# Patient Record
Sex: Female | Born: 1955 | Race: White | Hispanic: No | Marital: Married | Smoking: Never smoker
Health system: Southern US, Community
[De-identification: ages and names within clinical notes are randomized; demographics above are authoritative.]

## PROBLEM LIST (undated history)

## (undated) DIAGNOSIS — E785 Hyperlipidemia, unspecified: Secondary | ICD-10-CM

## (undated) DIAGNOSIS — I10 Essential (primary) hypertension: Secondary | ICD-10-CM

## (undated) HISTORY — DX: Essential (primary) hypertension: I10

## (undated) HISTORY — PX: TONSILLECTOMY: SHX5217

## (undated) HISTORY — DX: Hyperlipidemia, unspecified: E78.5

---

## 1997-06-13 ENCOUNTER — Ambulatory Visit (HOSPITAL_COMMUNITY): Admission: RE | Admit: 1997-06-13 | Discharge: 1997-06-13 | Payer: Self-pay | Admitting: *Deleted

## 1998-07-25 ENCOUNTER — Other Ambulatory Visit: Admission: RE | Admit: 1998-07-25 | Discharge: 1998-07-25 | Payer: Self-pay | Admitting: *Deleted

## 2000-09-21 ENCOUNTER — Other Ambulatory Visit: Admission: RE | Admit: 2000-09-21 | Discharge: 2000-09-21 | Payer: Self-pay | Admitting: *Deleted

## 2001-12-08 ENCOUNTER — Other Ambulatory Visit: Admission: RE | Admit: 2001-12-08 | Discharge: 2001-12-08 | Payer: Self-pay | Admitting: Obstetrics & Gynecology

## 2003-02-06 ENCOUNTER — Other Ambulatory Visit: Admission: RE | Admit: 2003-02-06 | Discharge: 2003-02-06 | Payer: Self-pay | Admitting: Obstetrics & Gynecology

## 2004-03-26 ENCOUNTER — Other Ambulatory Visit: Admission: RE | Admit: 2004-03-26 | Discharge: 2004-03-26 | Payer: Self-pay | Admitting: Obstetrics & Gynecology

## 2005-05-25 ENCOUNTER — Other Ambulatory Visit: Admission: RE | Admit: 2005-05-25 | Discharge: 2005-05-25 | Payer: Self-pay | Admitting: Obstetrics & Gynecology

## 2006-01-11 HISTORY — PX: CARDIAC CATHETERIZATION: SHX172

## 2006-01-19 ENCOUNTER — Ambulatory Visit: Payer: Self-pay | Admitting: Cardiology

## 2006-01-19 ENCOUNTER — Observation Stay (HOSPITAL_COMMUNITY): Admission: EM | Admit: 2006-01-19 | Discharge: 2006-01-20 | Payer: Self-pay | Admitting: Emergency Medicine

## 2006-02-05 ENCOUNTER — Ambulatory Visit: Payer: Self-pay | Admitting: Cardiology

## 2006-08-05 ENCOUNTER — Ambulatory Visit: Payer: Self-pay | Admitting: Cardiology

## 2007-09-07 ENCOUNTER — Ambulatory Visit: Payer: Self-pay | Admitting: Cardiology

## 2008-12-25 ENCOUNTER — Ambulatory Visit: Payer: Self-pay | Admitting: Family Medicine

## 2008-12-25 DIAGNOSIS — I10 Essential (primary) hypertension: Secondary | ICD-10-CM | POA: Insufficient documentation

## 2008-12-25 DIAGNOSIS — R635 Abnormal weight gain: Secondary | ICD-10-CM | POA: Insufficient documentation

## 2008-12-25 DIAGNOSIS — E785 Hyperlipidemia, unspecified: Secondary | ICD-10-CM | POA: Insufficient documentation

## 2009-01-01 ENCOUNTER — Encounter: Payer: Self-pay | Admitting: Family Medicine

## 2009-01-11 ENCOUNTER — Encounter (INDEPENDENT_AMBULATORY_CARE_PROVIDER_SITE_OTHER): Payer: Self-pay

## 2010-01-14 ENCOUNTER — Telehealth: Payer: Self-pay | Admitting: Family Medicine

## 2010-01-23 ENCOUNTER — Ambulatory Visit: Payer: Self-pay | Admitting: Family Medicine

## 2010-01-23 LAB — CONVERTED CEMR LAB
Albumin: 4.1 g/dL (ref 3.5–5.2)
BUN: 10 mg/dL (ref 6–23)
Basophils Absolute: 0 10*3/uL (ref 0.0–0.1)
Basophils Relative: 0.2 % (ref 0.0–3.0)
Bilirubin Urine: NEGATIVE
Bilirubin, Direct: 0.1 mg/dL (ref 0.0–0.3)
CO2: 24 meq/L (ref 19–32)
Calcium: 9.1 mg/dL (ref 8.4–10.5)
Chloride: 105 meq/L (ref 96–112)
Cholesterol: 164 mg/dL (ref 0–200)
Creatinine, Ser: 0.8 mg/dL (ref 0.4–1.2)
Eosinophils Absolute: 0.1 10*3/uL (ref 0.0–0.7)
Glucose, Bld: 96 mg/dL (ref 70–99)
Glucose, Urine, Semiquant: NEGATIVE
HDL: 50.8 mg/dL (ref >39.00)
Ketones, urine, test strip: NEGATIVE
Lymphocytes Relative: 25.2 % (ref 12.0–46.0)
MCHC: 34 g/dL (ref 30.0–36.0)
MCV: 94.8 fL (ref 78.0–100.0)
Monocytes Absolute: 0.8 10*3/uL (ref 0.1–1.0)
Neutrophils Relative %: 67.4 % (ref 43.0–77.0)
RBC: 4.88 M/uL (ref 3.87–5.11)
RDW: 12.8 % (ref 11.5–14.6)
Specific Gravity, Urine: 1.02
Total CHOL/HDL Ratio: 3
Total Protein: 7.2 g/dL (ref 6.0–8.3)
Triglycerides: 93 mg/dL (ref 0.0–149.0)
pH: 5

## 2010-02-03 ENCOUNTER — Ambulatory Visit: Payer: Self-pay | Admitting: Family Medicine

## 2010-02-03 ENCOUNTER — Encounter: Payer: Self-pay | Admitting: Family Medicine

## 2010-02-03 DIAGNOSIS — R319 Hematuria, unspecified: Secondary | ICD-10-CM | POA: Insufficient documentation

## 2010-02-25 ENCOUNTER — Telehealth: Payer: Self-pay | Admitting: Family Medicine

## 2010-02-25 ENCOUNTER — Ambulatory Visit: Payer: Self-pay | Admitting: Family Medicine

## 2010-02-25 LAB — CONVERTED CEMR LAB
Bilirubin Urine: NEGATIVE
Glucose, Urine, Semiquant: NEGATIVE
Protein, U semiquant: NEGATIVE
pH: 6

## 2010-03-27 ENCOUNTER — Encounter (INDEPENDENT_AMBULATORY_CARE_PROVIDER_SITE_OTHER): Payer: Self-pay | Admitting: *Deleted

## 2010-05-11 LAB — CONVERTED CEMR LAB
Alkaline Phosphatase: 82 units/L (ref 39–117)
BUN: 14 mg/dL (ref 6–23)
Basophils Relative: 1.2 % (ref 0.0–3.0)
Bilirubin Urine: NEGATIVE
Bilirubin, Direct: 0.1 mg/dL (ref 0.0–0.3)
Calcium: 9 mg/dL (ref 8.4–10.5)
Cholesterol: 178 mg/dL (ref 0–200)
Creatinine, Ser: 0.9 mg/dL (ref 0.4–1.2)
Eosinophils Absolute: 0.2 10*3/uL (ref 0.0–0.7)
Free T4: 0.6 ng/dL (ref 0.6–1.6)
HDL: 50.8 mg/dL (ref >39.00)
Ketones, urine, test strip: NEGATIVE
LDL Cholesterol: 105 mg/dL — ABNORMAL HIGH (ref 0–99)
Lymphocytes Relative: 25.9 % (ref 12.0–46.0)
MCHC: 33.9 g/dL (ref 30.0–36.0)
MCV: 93.7 fL (ref 78.0–100.0)
Monocytes Absolute: 0.5 10*3/uL (ref 0.1–1.0)
Neutrophils Relative %: 68.7 % (ref 43.0–77.0)
Nitrite: NEGATIVE
Platelets: 386 10*3/uL (ref 150.0–400.0)
Protein, U semiquant: NEGATIVE
RBC: 4.91 M/uL (ref 3.87–5.11)
T3, Free: 3 pg/mL (ref 2.3–4.2)
Total Bilirubin: 0.6 mg/dL (ref 0.3–1.2)
Total CHOL/HDL Ratio: 4
Total Protein: 7.4 g/dL (ref 6.0–8.3)
Triglycerides: 113 mg/dL (ref 0.0–149.0)
Urobilinogen, UA: 0.2
WBC: 15.9 10*3/uL — ABNORMAL HIGH (ref 4.5–10.5)

## 2010-05-13 NOTE — Assessment & Plan Note (Signed)
Summary: cpx---no pap//---add flu shot//ccm   Vital Signs:  Patient profile:   55 year old female Menstrual status:  regular Height:      65.5 inches Weight:      152 pounds BMI:     25.00 Temp:     98.5 degrees F oral BP sitting:   110 / 70  (left arm) Cuff size:   regular  Vitals Entered By: Kern Reap CMA Duncan Dull) (February 03, 2010 3:48 PM) CC: cpx Is Patient Diabetic? No Pain Assessment Patient in pain? no        CC:  cpx.  History of Present Illness: Rachel Vance is a 55 year old single female, G1, P1, nonsmoker, who comes in today for evaluation of hypertension, and hyperlipidemia, and a general physical examination  Her hypertension is treated with Norvasc 5 mg daily, lisinopril, 10 mg daily, BP 110/70.  Hyperlipidemia.  History with Prevacid and 40 mg nightly lipids are ago with an LDL of 95.  She was recently taken off her BCPs by Dr. Aldona Bar.........  She takes an aspirin tablet daily.  She gets routine eye care, dental care, never had a colonoscopy...Marland KitchenMarland KitchenMarland Kitchen I will call to get this set up for her......... tetanus  2010 seasonal flu shot today  Allergies: 1)  ! Penicillin  Past History:  Past medical, surgical, family and social histories (including risk factors) reviewed, and no changes noted (except as noted below).  Past Medical History: Reviewed history from 12/25/2008 and no changes required. Hyperlipidemia Hypertension  Past Surgical History: Reviewed history from 12/25/2008 and no changes required. Tonsillectomy cardiac catheterization October 2007, normal coronaries  Family History: Reviewed history from 12/25/2008 and no changes required. Father: deceased 54 - heart attack Mother: 5 by-pass, DM, HTN, high cholesterol Siblings: none Children: 1 son - healthy  Social History: Reviewed history from 12/25/2008 and no changes required. Occupation: para legal Divorced Never Smoked Alcohol use-yes Drug use-no Regular exercise-yes  Review of  Systems      See HPI       Flu Vaccine Consent Questions     Do you have a history of severe allergic reactions to this vaccine? no    Any prior history of allergic reactions to egg and/or gelatin? no    Do you have a sensitivity to the preservative Thimersol? no    Do you have a past history of Guillan-Barre Syndrome? no    Do you currently have an acute febrile illness? no    Have you ever had a severe reaction to latex? no    Vaccine information given and explained to patient? yes    Are you currently pregnant? no    Lot Number:[id number]   Exp Date:10/11/2010   Site Given  Left Deltoid IM   Physical Exam  General:  Well-developed,well-nourished,in no acute distress; alert,appropriate and cooperative throughout examination Head:  Normocephalic and atraumatic without obvious abnormalities. No apparent alopecia or balding. Eyes:  No corneal or conjunctival inflammation noted. EOMI. Perrla. Funduscopic exam benign, without hemorrhages, exudates or papilledema. Vision grossly normal. Ears:  External ear exam shows no significant lesions or deformities.  Otoscopic examination reveals clear canals, tympanic membranes are intact bilaterally without bulging, retraction, inflammation or discharge. Hearing is grossly normal bilaterally. Nose:  External nasal examination shows no deformity or inflammation. Nasal mucosa are pink and moist without lesions or exudates. Mouth:  Oral mucosa and oropharynx without lesions or exudates.  Teeth in good repair. Neck:  No deformities, masses, or tenderness noted. Chest Wall:  No deformities,  masses, or tenderness noted. Breasts:  No mass, nodules, thickening, tenderness, bulging, retraction, inflamation, nipple discharge or skin changes noted.   Lungs:  Normal respiratory effort, chest expands symmetrically. Lungs are clear to auscultation, no crackles or wheezes. Heart:  Normal rate and regular rhythm. S1 and S2 normal without gallop, murmur, click, rub or  other extra sounds. Abdomen:  Bowel sounds positive,abdomen soft and non-tender without masses, organomegaly or hernias noted. Msk:  No deformity or scoliosis noted of thoracic or lumbar spine.   Pulses:  R and L carotid,radial,femoral,dorsalis pedis and posterior tibial pulses are full and equal bilaterally Extremities:  No clubbing, cyanosis, edema, or deformity noted with normal full range of motion of all joints.   Neurologic:  No cranial nerve deficits noted. Station and gait are normal. Plantar reflexes are down-going bilaterally. DTRs are symmetrical throughout. Sensory, motor and coordinative functions appear intact. Skin:  Intact without suspicious lesions or rashes Cervical Nodes:  No lymphadenopathy noted Axillary Nodes:  No palpable lymphadenopathy Inguinal Nodes:  No significant adenopathy Psych:  Cognition and judgment appear intact. Alert and cooperative with normal attention span and concentration. No apparent delusions, illusions, hallucinations   Impression & Recommendations:  Problem # 1:  ROUTINE GENERAL MEDICAL EXAM@HEALTH  CARE FACL (ICD-V70.0) Assessment Unchanged  Orders: Prescription Created Electronically [id number]) EKG w/ Interpretation (93000)  Problem # 2:  HYPERLIPIDEMIA (ICD-272.4) Assessment: Improved  Her updated medication list for this problem includes:    Pravastatin Sodium 40 Mg Tabs (Pravastatin sodium) .Marland Kitchen... Take one tab once daily  Orders: Prescription Created Electronically [phone])  Problem # 3:  HYPERTENSION (ICD-401.9) Assessment: Improved  Her updated medication list for this problem includes:    Amlodipine Besylate 5 Mg Tabs (Amlodipine besylate) .Marland Kitchen... Take one tab once daily    Lisinopril 10 Mg Tabs (Lisinopril) .Marland Kitchen... Take one tab once daily  Orders: Prescription Created Electronically [id number]) EKG w/ Interpretation (93000)  Complete Medication List: 1)  Amlodipine Besylate 5 Mg Tabs (Amlodipine besylate) .... Take one tab once  daily 2)  Lisinopril 10 Mg Tabs (Lisinopril) .... Take one tab once daily 3)  Pravastatin Sodium 40 Mg Tabs (Pravastatin sodium) .... Take one tab once daily 4)  Jolessa 0.15-0.03 Mg Tabs (Levonorgest-eth estrad 91-day) .... Take one tab once daily 5)  Centrum Tabs (Multiple vitamins-minerals) .... Take one tab once daily 6)  Aspirin 81 Mg Tbec (Aspirin) .... Once daily 7)  Septra Ds 800-160 Mg Tabs (Sulfamethoxazole-trimethoprim) .... Take 1 tablet by mouth two times a day  Other Orders: UA Dipstick w/o Micro (manual) (87564) Gastroenterology Referral (GI) Admin 1st Vaccine (33295) Flu Vaccine 30yrs + (18841)  Patient Instructions: 1)  Please schedule a follow-up appointment in 1 year. 2)  It is important that you exercise regularly at least 20 minutes 5 times a week. If you develop chest pain, have severe difficulty breathing, or feel very tired , stop exercising immediately and seek medical attention. 3)  Schedule your mammogram. 4)  Schedule a colonoscopy/sigmoidoscopy to help detect colon cancer. 5)  Take calcium +Vitamin D daily. 6)  Take an Aspirin every day. 7)  Begin Septra DS, one twice daily for 10 days.  Return in 3 weeks to recheck urine Prescriptions: SEPTRA DS 800-160 MG TABS (SULFAMETHOXAZOLE-TRIMETHOPRIM) Take 1 tablet by mouth two times a day  #20 x 0   Entered and Authorized by:   Roderick Pee MD   Signed by:   Roderick Pee MD on 02/03/2010   Method used:   Print  then Give to Patient   RxID:   [id number] PRAVASTATIN SODIUM 40 MG TABS (PRAVASTATIN SODIUM) take one tab once daily  #100 x 3   Entered and Authorized by:   Roderick Pee MD   Signed by:   Roderick Pee MD on 02/03/2010   Method used:   Print then Give to Patient   RxID:   [id number] LISINOPRIL 10 MG TABS (LISINOPRIL) take one tab once daily  #100 x 3   Entered and Authorized by:   Roderick Pee MD   Signed by:   Roderick Pee MD on 02/03/2010   Method used:   Print then Give to  Patient   RxID:   [id number] AMLODIPINE BESYLATE 5 MG TABS (AMLODIPINE BESYLATE) take one tab once daily  #100 x 3   Entered and Authorized by:   Roderick Pee MD   Signed by:   Roderick Pee MD on 02/03/2010   Method used:   Print then Give to Patient   RxID:   [id number]    Orders Added: 1)  Prescription Created Electronically [G8553] 2)  Est. Patient 40-64 years [99396] 3)  Est. Patient Level III [42595] 4)  UA Dipstick w/o Micro (manual) [81002] 5)  Gastroenterology Referral [GI] 6)  EKG w/ Interpretation [93000] 7)  Admin 1st Vaccine [90471] 8)  Flu Vaccine 83yrs + [63875]

## 2010-05-13 NOTE — Progress Notes (Signed)
Summary: REFILL REQUEST  Phone Note Refill Request Message from:  Pharmacy on January 14, 2010 1:52 PM  Refills Requested: Medication #1:  LISINOPRIL 10 MG TABS take one tab once daily   Notes: [facility].  Medication #2:  AMLODIPINE BESYLATE 5 MG TABS take one tab once daily   Notes: [facility].  Medication #3:  PRAVASTATIN SODIUM 40 MG TABS take one tab once daily   Notes: [facility].    Initial call taken by: Debbra Riding,  January 14, 2010 1:53 PM    Prescriptions: AMLODIPINE BESYLATE 5 MG TABS (AMLODIPINE BESYLATE) take one tab once daily  #30 x 0   Entered and Authorized by:   Kern Reap CMA (AAMA)   Signed by:   Kern Reap CMA (AAMA) on 01/14/2010   Method used:   Faxed to ...       [facility] (retail)       [address]       [place], [place]  [address]       Ph: [phone]       Fax: [phone]   RxID:   [id number] PRAVASTATIN SODIUM 40 MG TABS (PRAVASTATIN SODIUM) take one tab once daily  #30 x 0   Entered and Authorized by:   Kern Reap CMA (AAMA)   Signed by:   Kern Reap CMA (AAMA) on 01/14/2010   Method used:   Faxed to ...       [facility] (retail)       [address]       [place], [place]  [address]       Ph: [phone]       Fax: [phone]   RxID:   [id number] LISINOPRIL 10 MG TABS (LISINOPRIL) take one tab once daily  #30 x 0   Entered and Authorized by:   Kern Reap CMA (AAMA)   Signed by:   Kern Reap CMA (AAMA) on 01/14/2010   Method used:   Faxed to ...       [facility] (retail)       [address]       [place], [place]  [address]       Ph: [phone]       Fax: [phone]   RxID:   [id number]

## 2010-05-13 NOTE — Progress Notes (Signed)
Summary: REQ FOR PROCEDURE (EAR IRRIGATION?)  Phone Note Call from Patient   Caller: Patient     [phone]  /   [phone] Summary of Call: Pt called to see if there is any way that Dr Tawanna Cooler may be able to clean / irrigate her ear today.... Pt adv that she is scheduled to come in for a f/u visit today at 3:15pm and wants to know if there is any way possible that this appt can be used to have her ears washed out (pt realizes this may take longer than her f/u appt will allow)... Pt adv that she has been using debrox to help with ear wax / congestion and now she feels that her ear is infected, pt is in a lot of pain and can't hear out of ear.... Pt has appt today for f/u but wants to make sure she doesn't make Dr Tawanna Cooler get behind if she comes in for both the f/u and ear problem... Pt adv that she can come back for f/u if needed, she just has to have something done about her ear.  Please let me know if you will need pt to come in earlier at a different time or what can be done for pt?  Initial call taken by: Debbra Riding,  February 25, 2010 8:35 AM

## 2010-05-13 NOTE — Assessment & Plan Note (Signed)
Summary: 3 WEEK FUP//CCM   Vital Signs:  Patient profile:   55 year old female Menstrual status:  regular Weight:      154 pounds Temp:     98.2 degrees F oral BP sitting:   114 / 72  (left arm) Cuff size:   regular  Vitals Entered By: Alfred Levins, CMA (February 25, 2010 3:14 PM) CC: f/u hematuria, can't hear out of right ear, dizzy   CC:  f/u hematuria, can't hear out of right ear, and dizzy.  History of Present Illness: Rachel Vance is a 55 year old single female, who comes in today for evaluation of two problems.  We saw a couple weeks ago for physical examination she had asymptomatic hematuria.  She was placed on Septra DS for 10 days and comes back today for follow-up.   shows just a trace of RBCs.  She also has wax in right ear.  She would like removed  Current Medications (verified): 1)  Amlodipine Besylate 5 Mg Tabs (Amlodipine Besylate) .... Take One Tab Once Daily 2)  Lisinopril 10 Mg Tabs (Lisinopril) .... Take One Tab Once Daily 3)  Pravastatin Sodium 40 Mg Tabs (Pravastatin Sodium) .... Take One Tab Once Daily 4)  Jolessa 0.15-0.03 Mg Tabs (Levonorgest-Eth Estrad 91-Day) .... Take One Tab Once Daily 5)  Centrum  Tabs (Multiple Vitamins-Minerals) .... Take One Tab Once Daily 6)  Aspirin 81 Mg Tbec (Aspirin) .... Once Daily  Allergies (verified): 1)  ! Penicillin  Past History:  Past medical, surgical, family and social histories (including risk factors) reviewed for relevance to current acute and chronic problems.  Past Medical History: Reviewed history from 12/25/2008 and no changes required. Hyperlipidemia Hypertension  Past Surgical History: Reviewed history from 12/25/2008 and no changes required. Tonsillectomy cardiac catheterization October 2007, normal coronaries  Family History: Reviewed history from 12/25/2008 and no changes required. Father: deceased 30 - heart attack Mother: 5 by-pass, DM, HTN, high cholesterol Siblings: none Children: 1 son -  healthy  Social History: Reviewed history from 12/25/2008 and no changes required. Occupation: para legal Divorced Never Smoked Alcohol use-yes Drug use-no Regular exercise-yes  Review of Systems      See HPI  Physical Exam  Ears:  earwax white be removed with suction and irrigation   Impression & Recommendations:  Problem # 1:  HEMATURIA UNSPECIFIED (ICD-599.70) Assessment Improved  The following medications were removed from the medication list:    Septra Ds 800-160 Mg Tabs (Sulfamethoxazole-trimethoprim) .Marland Kitchen... Take 1 tablet by mouth two times a day  Orders: UA Dipstick w/o Micro (manual) ([address])  Complete Medication List: 1)  Amlodipine Besylate 5 Mg Tabs (Amlodipine besylate) .... Take one tab once daily 2)  Lisinopril 10 Mg Tabs (Lisinopril) .... Take one tab once daily 3)  Pravastatin Sodium 40 Mg Tabs (Pravastatin sodium) .... Take one tab once daily 4)  Jolessa 0.15-0.03 Mg Tabs (Levonorgest-eth estrad 91-day) .... Take one tab once daily 5)  Centrum Tabs (Multiple vitamins-minerals) .... Take one tab once daily 6)  Aspirin 81 Mg Tbec (Aspirin) .... Once daily   Orders Added: 1)  Est. Patient Level III [60454] 2)  UA Dipstick w/o Micro (manual) [81002]    Laboratory Results   Urine Tests  Date/Time Received: February 25, 2010   Routine Urinalysis   Color: yellow Appearance: Clear Glucose: negative   (Normal Range: Negative) Bilirubin: negative   (Normal Range: Negative) Ketone: negative   (Normal Range: Negative) Spec. Gravity: 1.015   (Normal Range: 1.003-1.035) Blood: small   (  Normal Range: Negative) pH: 6.0   (Normal Range: 5.0-8.0) Protein: negative   (Normal Range: Negative) Urobilinogen: 0.2   (Normal Range: 0-1) Nitrite: negative   (Normal Range: Negative) Leukocyte Esterace: negative   (Normal Range: Negative)    Comments: Kern Reap CMA Duncan Dull)  February 25, 2010 4:52 PM

## 2010-05-15 NOTE — Letter (Signed)
Summary: [facility] Referral (unable to schedule) Notification  [facility]  [address] [place], [place] [address]   Phone: [phone]  Fax: [phone]      March 27, 2010 Rachel Vance [birth year] MRN: [id number]   [facility] [address] [place], [place]  [address]   Dear Dr. Tawanna Cooler:   Thank you for your kind referral of the above patient. We have attempted to schedule the recommended Colonoscopy but have been unable to schedule because:  _x_ The patient was not available by phone and/or has not returned our calls.  __ The patient declined to schedule the procedure at this time.  We appreciate the referral and hope that we will have the opportunity to treat this patient in the future.    Sincerely,   [facility]  Vania Rea. Jarold Motto M.D. Hedwig Morton. Juanda Chance M.D. Venita Lick. Russella Dar M.D. Wilhemina Bonito. Marina Goodell M.D. Barbette Hair. Arlyce Dice M.D. Iva Boop M.D. Cheron Every.D.

## 2010-05-20 ENCOUNTER — Encounter: Payer: Self-pay | Admitting: Family Medicine

## 2010-05-20 ENCOUNTER — Ambulatory Visit (INDEPENDENT_AMBULATORY_CARE_PROVIDER_SITE_OTHER): Payer: BC Managed Care – PPO | Admitting: Family Medicine

## 2010-05-20 VITALS — BP 110/74 | Temp 98.0°F | Ht 66.0 in | Wt 153.0 lb

## 2010-05-20 DIAGNOSIS — J069 Acute upper respiratory infection, unspecified: Secondary | ICD-10-CM

## 2010-05-20 DIAGNOSIS — J45901 Unspecified asthma with (acute) exacerbation: Secondary | ICD-10-CM

## 2010-05-20 MED ORDER — HYDROCODONE-HOMATROPINE 5-1.5 MG/5ML PO SYRP: 5.0000 mL | ORAL_SOLUTION | Freq: Four times a day (QID) | ORAL | Status: AC | PRN

## 2010-05-20 MED ORDER — PREDNISONE 20 MG PO TABS: ORAL_TABLET | ORAL | Status: DC

## 2010-05-20 NOTE — Patient Instructions (Signed)
Drink lots of liquids.  Vaporizer in your bedroom at night.  Afrin  one shot up each nostril at bedtime x 5 nights, then stop  Hydromet one half to 1 teaspoon 3 times a day as needed for cough.  Prednisone as directed.  Return p.r.n.

## 2010-05-20 NOTE — Progress Notes (Signed)
  Subjective:    Patient ID: Rachel Vance, female    DOB: Mar 01, 1956, 55 y.o.   MRN: [id number]  HPI Rachel Vance Is a 55 year old single female, nonsmoker, who comes in with a 6-day history of head congestion, sore throat, and cough.  She said the fever, earache, sputum production, nausea, vomiting, or diarrhea.  She does have a history of allergic rhinitis and occasional asthma when she gets a bad viral syndrome.     Review of Systems 12 point review of systems negative    Objective:   Physical Exam She is a well-developed, well-nourished, female, in no acute distress.  Examination of the HEENT negative.  Neck was supple.  No adenopathy.  Lungs exam shows symmetrical.  Breath sounds late expiratory wheezing.  No crackles       Assessment & Plan:  Viral syndrome with secondary asthma.  Think lots of liquids, Hydromet for cough, prednisone burst and taper.  Return p.r.n.

## 2010-08-26 NOTE — Assessment & Plan Note (Signed)
[facility]                            CARDIOLOGY OFFICE NOTE   Rachel Vance                      MRN:          [id number]  DATE:09/07/2007                            DOB:          08-Apr-1956    PRIMARY CARE PHYSICIAN:  Dr. Neva Seat with Urgent Care on [address]   REASON FOR VISIT:  Routine follow-up.   HISTORY OF PRESENT ILLNESS:  [name] is doing well.  She was last  seen here back in April 2008.  She has very good blood pressure control  today and her electrocardiogram shows no major changes in comparison to  the previous tracing.  Lead placement was somewhat different.  She is  not reporting any significant chest pain or dyspnea on exertion.  Her  weight is up about 3 pounds, and we talked about aerobic exercise on a  more regular basis.   ALLERGIES:  PENICILLIN.   MEDICATIONS:  1. Norvasc 5 mg p.o. daily.  2. Tricor 48 mg p.o. daily.  3. Aspirin 81 mg p.o. daily.  4. Multivitamin daily.  5. Benicar 20 mg p.o. daily.   REVIEW OF SYSTEMS:  As described in the history of present illness,  otherwise negative.   PHYSICAL EXAMINATION:  VITAL SIGNS:  Blood pressure today is 110/72,  heart rate is 64, weight 148 pounds.  GENERAL:  The patient is comfortable in no acute distress.  NECK:  Reveals no elevated jugulovenous pressure.  No loud bruits.  LUNGS:  Clear without labored breathing at rest.  CARDIAC:  Reveals a regular rate and rhythm.  No loud murmur or gallop.  EXTREMITIES:  Show no significant pitting edema.   IMPRESSION/RECOMMENDATIONS:  History of previously documented normal  coronary arteries in October 2007.  She does have hypertension and was  seen by Dr. Evlyn Kanner in the past given a mildly abnormal vanillimandelic  acid level in the urine.  This was felt to be rather nonspecific in the  setting of beta-blocker therapy and was in fact repeated and found to be  normal.  She is on the medications outlined above and doing  well.  At  this point, I do not anticipate any further cardiac evaluation given the  fact that she has been quite stable on her last two visits.  I suggested  that she continue to follow with Dr. Neva Seat on a regular basis and let  him assume follow-up of her blood pressure, which is very well-  controlled.  I wonder if she in  fact still needs two agents.  It might be worth trying to simplify this.  Otherwise, we talked about basic exercise.  Follow-up here can be p.r.n.     Jonelle Sidle, MD  Electronically Signed    SGM/MedQ  DD: 09/07/2007  DT: 09/07/2007  Job #: [id number]   cc:   Shade Flood, M.D.

## 2010-08-29 NOTE — Cardiovascular Report (Signed)
NAMEZASHA, BELLEAU               ACCOUNT NO.:  [id number]   MEDICAL RECORD NO.:  [id number]          PATIENT TYPE:  INP   LOCATION:  3703                         FACILITY:  [facility]   PHYSICIAN:  Everardo Beals. Juanda Chance, MD, FACCDATE OF BIRTH:  17-Jun-1955   DATE OF PROCEDURE:  01/20/2006  DATE OF DISCHARGE:                              CARDIAC CATHETERIZATION   CLINICAL HISTORY:  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  There is no prior history of known heart disease.  She came to the emergency  room with symptoms of chest pain and palpitations which were improved with  nitroglycerin.  Her markers were negative.  She was seen in consultation by  Dr. Diona Browner who arranged for her to be evaluated with angiography.  She has  a very positive family history for coronary heart disease in her mother,  [name], who is a patient of mine.   PROCEDURE:  The procedure was performed via the right femoral and arterial  sheath and 6-French __________  coronary catheters.  __________  and  Omnipaque contrast was used.  The patient tolerated procedure well.  The  right femoral was closed with Angio-Seal.   RESULTS:  The aortic pressure was 178/102 with mean of 177.  Left neck  pressure was 178/23.   Left main was free of disease.   Left anterior descending artery gave rise to a large optional diagonal  branch and two septal perforators.  These and the LV proper were free of  significant disease.   The circumflex artery gave rise to an AV branch and a marginal branch.  These vessels were free of significant disease.   The right coronary was a moderate-sized vessel that gave rise to clonus  branch, right ventricle branch, posterior branch and posterolateral branch.  These vessels were free of significant disease.   The left ventriculogram performed in the RAO projection showed vigorous wall  motion.  The very tip of the apex was akinetic.  The estimated ejection  fraction was 60%.   CONCLUSION:  1. Normal coronary angiography.  2. Normal overall left ventricular function with a very small area at the      tip of the apex with akinesis.   RECOMMENDATIONS:  I am not sure of the significance of the very small wall  motion abnormality to apex, thought I think it is unlikely related to her  clinical symptoms and unlikely to affect her prognosis.  Will plan  reassurance.  She will need treatment for her hypertension.  Because of the  abrupt onset of hypertension and palpitations, we might consider evaluation  for __________           ______________________________  Everardo Beals Juanda Chance, MD, [facility]     BRB/MEDQ  D:  01/20/2006  T:  01/21/2006  Job:  [id number]   cc:   Jonelle Sidle, MD  Everardo Beals. Juanda Chance, MD, [facility]  Cardiopulmonary Lab

## 2010-08-29 NOTE — Assessment & Plan Note (Signed)
[facility]                              CARDIOLOGY OFFICE NOTE   Janelle Floor MALLY GAVINA                      MRN:          [id number]  DATE:02/05/2006                            DOB:          02-17-1956    REASON FOR VISIT:  Followup hospitalization.   HISTORY OF PRESENT ILLNESS:  I saw Ms. Atkins recently in the hospital  following presentation with chest pain, shortness of breath, and tachy  palpitations.  She ruled out for myocardial infarction with serial cardiac  markers and was ultimately referred for a diagnostic cardiac catheterization  given a substantial family history of premature cardiovascular disease.  This study was performed by Dr. Juanda Chance on 10 October and revealed normal  coronary arteries with left ventricular ejection fraction of 60%.  He noted  a very small area at the tip of the apex that looked to be akinetic,  although in the setting of overall normal function, was not felt to be of  clinical significance, particularly in light of her normal enzymes.  She was  noted to have blood pressures elevated in the 170/100 range and was  initiated on both beta blocker and calcium channel blocker therapy during  her hospital stay.  Dr. Juanda Chance arranged for her to have a 24-hour urine  collection for metanephrines and vanillylmandelic acid.  She is referred to  the office today to review her testing.   Symptomatically, she states that she is feeling much better.  She is not  having any significant chest pain or major palpitations.  She has changed  her diet substantially and is predominantly focusing on protein and salad.  She does state that she has been more fatigued and very hungry, and I  suspect that perhaps she has cut her carbohydrate load back perhaps too  much.  We talked about a basic exercise regimen.  Her electrocardiogram  today is normal, demonstrating normal sinus rhythm at 66 beats per minute.   I reviewed her recent  24-hour urine collection.  Her metanephrine and  normetanephrine levels were within normal range.  She did have an increased  vanillylmandelic acid level of 27.7 (normal range 1.8 to 6.7).  She brought  in blood pressure and heart rate checks from home which most recently show  systolic blood pressures ranging from 102 up to 142, although typically in  the 120s to 130s over diastolics of 70s to 80s and with heart rates in the  60s to 70s.  She has had no obvious dizziness or syncope.   ALLERGIES:  PENICILLIN.   PRESENT MEDICATIONS:  1. Multivitamin 1 p.o. daily.  2. Aspirin 81 mg p.o. daily.  3. Tri-Cor 48 mg p.o. daily.  4. Lopressor 25 mg 1-1/2 tablets p.o. b.i.d.  5. Norvasc 5 mg p.o. daily.   REVIEW OF SYSTEMS:  As in History of Present Illness, otherwise negative.   PHYSICAL EXAMINATION:  VITAL SIGNS: Weight 141 pounds.  Blood pressure  122/76, heart rate 66.  GENERAL:  The patient is comfortable and in no acute distress.  NECK:  Examination reveals no elevated jugular venous  pressure or loud  bruits.  No thyromegaly is noted.  LUNGS:  Clear without labored breathing at rest.  CARDIAC:  Exam reveals a regular rate and rhythm without rub, murmur, or  gallop.  No mid systolic click noted.  EXTREMITIES: Show no significant pitting edema.  SKIN:  Warm and dry.   IMPRESSION AND RECOMMENDATIONS:  1. Recently documented normal coronary arteries at catheterization      following presentation with chest pain.  Will plan general risk factor      modification.  We talked about diet and exercise today.  2. Recently diagnosed hypertension as outlined above.  Dr.  Juanda Chance      referred the patient for 24-hour urine collection for metanephrines and      vanillylmandelic acid.  Overall, this testing was reassuring with the      exception of the vanillylmandelic acid level.  I discussed this with      the patient today, and at this point am not certain that this is of      clinical  significance.  Given the fact that the question has been      raised regarding possible evaluation for feochromocytoma, I will plan      to refer Ms. Atkins to Dr. Evlyn Kanner to discuss the issue further.      Otherwise, she will remain on her present medications.     Jonelle Sidle, MD    SGM/MedQ  DD: 02/05/2006  DT: 02/06/2006  Job #: [id number]

## 2010-08-29 NOTE — Discharge Summary (Signed)
NAMEHARRY, Rachel Vance               ACCOUNT NO.:  [id number]   MEDICAL RECORD NO.:  [id number]          PATIENT TYPE:  INP   LOCATION:  3703                         FACILITY:  [facility]   PHYSICIAN:  Everardo Beals. Juanda Chance, MD, FACCDATE OF BIRTH:  [birth year]   DATE OF ADMISSION:  01/19/2006  DATE OF DISCHARGE:  01/20/2006                                 DISCHARGE SUMMARY   PRINCIPAL DIAGNOSIS:  Chest pain.   SECONDARY DIAGNOSES:  1. Tachy palpitations.  2. Dyspnea.  3. Hypertension (newly diagnosed).  4. History of dysfunctional uterine bleeding.  5. Status post tonsillectomy at age 16.   ALLERGIES:  PENICILLIN:  Causes swelling and anaphylaxis.   PROCEDURE:  Left heart cardiac catheterization.   HISTORY OF PRESENT ILLNESS:  A 55 year old white female with no prior  history of CAD.  Over the past couple of days she has noted some dyspnea on  exertion that seemed out of the ordinary for her.  On the morning of January 19, 2006 she awoke at approximately 5:00 a.m. with tachy palpations as well  as 8 to 9 out of 10 left chest, shoulder, and biceps burning associated with  shortness of breath.  Symptoms persisted at that level for approximately 3  hours and were no worse or better with her morning activity, which included  showering and getting dressed.  She did note fatigue with those activities  however.  At approximately 9:30 a.m., she presented to [facility]  where she had some improvement in her chest discomfort and, following an ECG  that showed no acute changes, EMS was called.  She was given 3 sublingual  nitroglycerin by EMS and then 2 additional nitroglycerin in the [facility]  ED with moderate relief of chest discomfort.  The decision was made to admit  her for further evaluation.   HOSPITAL COURSE:  She ruled out for MI.  Because she had a family history of  CAD with a father who died at age 36 of an MI as well as symptoms that were  somewhat improved with nitroglycerin,  we opted for left heart cardiac  catheterization, which was performed on January 20, 2006 revealing normal  coronary arteries with an EF of 60%.  She remained hypertensive throughout  her catheterization with systolic blood pressures in the 170s and diastolics  in the 100s.  Therefore, she was initiated on not only beta-blockers, but  also calcium channel blocker therapy.  Because of her symptoms of tachy  palpitations and chest pain in the setting of marked hypertension, we will  obtain a 24-hour urine for VMA, catecholamines, and metanephrines to be  followed up as an outpatient.  She is being discharged home this afternoon  in satisfactory condition.   DISCHARGE LABS:  Hemoglobin 13.9, hematocrit 43, WBC 13.2, platelets  415,000, MCV 92.3.  Sodium 141, potassium 4, chloride 109, CO2 24, BUN 6,  creatinine 0.8, glucose 101.  PT 13.9, INR 1.1, PTT 27.  Total bilirubin  0.4, alkaline phosphatase 74, AST 26, ALT 24, albumin 3.6.  CK 44, MB 0.6,  troponin I 0.02.  Total cholesterol 188, triglycerides 890, HDL 39.  Calcium  8.9, magnesium 2.3.  Urine pregnancy was negative.  TSH 2.528.   DISPOSITION:  The patient is being discharged home today in good condition.   FOLLOWUP PLANS AND APPOINTMENTS:  She will follow up with Dr. Simona Huh  on February 05, 2006 at 10:45 a.m.  We will set her up with 24-hour urine, as  describe above, to rule out pheochromocytoma and she will return to our  office in 24 hours following discharge.   DISCHARGE MEDICATIONS:  1. Norvasc 5 mg daily.  2. Lopressor 25 mg 1-1/2 tablets b.i.d.  3. Tricor 48 mg daily.   PENDING LAB STUDIES:  None.   DURATION OF DISCHARGE ENCOUNTER:  35 minutes including physician time.   She is being discharged home today in good condition.     ______________________________  Nicolasa Ducking, ANP    ______________________________  Everardo Beals. Juanda Chance, MD, [facility]    CB/MEDQ  D:  01/20/2006  T:  01/21/2006  Job:  [id number]    cc:   [facility]

## 2010-08-29 NOTE — Assessment & Plan Note (Signed)
Rachel Vance                            CARDIOLOGY OFFICE NOTE   Rachel Vance                      MRN:          [id number]  DATE:08/05/2006                            DOB:          1955-05-28    PRIMARY CARE Barb Shear:  Nilda Simmer, M.D., with Urgent Care on [address]   REASON FOR VISIT:  Routine followup.   HISTORY OF PRESENT ILLNESS:  I saw Rachel Vance back in October.  Her  history is detailed in the previous note.  I referred her to Dr. Evlyn Kanner  given a mildly abnormal vanillimandelic acid level.  She was seen and  actually had very good blood pressure control, ultimately with findings  of repeat normal urinary catecholamines off of beta-blocker therapy.  Dr. Evlyn Kanner did not recommend any further evaluation and she returns today  stating that she has had no significant palpitations, chest pain or  dyspnea on exertion.  She has been trying to do some walking, although  was concerned that her weight has increased some.  Today we talked about  a basic aerobic exercise regimen.  She does have a family history of  cardiovascular disease but very reassuring cardiac catheterization  demonstrating normal coronary arteries in October of last year.  I do  not anticipate any additional cardiac studies at this particular time.   ALLERGIES:  PENICILLIN.   PRESENT MEDICATIONS:  1. Norvasc 5 mg p.o. daily.  2. TriCor 48 mg p.o. daily.  3. Aspirin 81 mg p.o. daily.  4. Benicar 20 mg p.o. daily.  5. Multivitamin one p.o. daily.   REVIEW OF SYSTEMS:  As described in the History of Present Illness.  Otherwise negative.   EXAMINATION:  Blood pressure is 130/80, heart rate is 85, weight is 145  pounds.  NECK:  No elevated jugular venous pressure without bruits.  No  thyromegaly noted.  LUNGS:  Clear without labored breathing at rest.  CARDIAC EXAM:  Reveals a regular rate and rhythm without rub, murmur or  gallop.  EXTREMITIES:  No significant  edema.   IMPRESSION RECOMMENDATION:  1. History of normal coronary arteries at catheterization in October      of 2007.  Patient is asymptomatic at this point without chest pain,      dyspnea or palpitations.  Would recommend basic risk factor      modification strategies and I will plan to see her back for symptom      review      in 1 year's time.  She plans to followup in the interim with Nilda Simmer.  2. Essential hypertension, anticipate continued medical therapy.     Jonelle Sidle, MD  Electronically Signed    SGM/MedQ  DD: 08/05/2006  DT: 08/05/2006  Job #: [id number]   cc:   Nilda Simmer, M.D.

## 2010-08-29 NOTE — H&P (Signed)
Rachel Vance, Rachel Vance               ACCOUNT NO.:  [id number]   MEDICAL RECORD NO.:  [id number]          PATIENT TYPE:  INP   LOCATION:  3703                         FACILITY:  [facility]   PHYSICIAN:  Jonelle Sidle, MD DATE OF BIRTH:  27-Aug-1955   DATE OF ADMISSION:  01/19/2006  DATE OF DISCHARGE:                                HISTORY & PHYSICAL   PRIMARY CARDIOLOGIST:  The patient is newly being seen by Dr. Diona Browner.  She  does not have a primary care physician but does frequent [facility].   PATIENT PROFILE:  A 55 year old white female with no prior history of CAD  who presents with chest pain, shortness of breath, and tachypalpitations.   PROBLEM LIST:  1. Chest pain.  2. Tachypalpitations.  3. Dyspnea.  4. Dysfunctional uterine bleeding.  5. Status post tonsillectomy at age 68.   HISTORY OF PRESENT ILLNESS:  A 55 year old white female with no prior  history of CAD.  She was out walking on Sunday and noted some dyspnea that  seemed out of the ordinary but she did not think too much of it.  This  morning she woke up at about 5 a.m. with tachypalpitations as well as 8-9/10  left chest, shoulder and biceps burning associated with shortness of breath.  Symptoms persisted at that level until approximately 8 a.m. when her dyspnea  and tachypalpitations resolved.  However, she continued to have some mild  chest burning.  Symptoms did not worsen or improve with showering, getting  dressed, etc., but she did note fatigue with all those activities.  At  approximately 9:30 a.m. she went to [facility] where an ECG was  performed and showed no acute changes.  EMS was called and she was treated  with three sublingual nitroglycerin with partial relief of discomfort and  then arrived in the ED and was treated with two more sublingual  nitroglycerin.  On initial interview she said she has minimal chest pain,  but otherwise was comfortable.   ALLERGIES:  PENICILLIN causes  swelling and anaphylaxis.   HOME MEDICATIONS:  1. Aspirin 81 mg daily.  2. Seasonal one tablet daily.  3. Multivitamin daily.   FAMILY HISTORY:  Mother is alive at age 63 with diabetes.  Father died of an  MI at age 99.  She has no siblings.   SOCIAL HISTORY:  She lives in [place] with her 53 year old son.  She  works as a IT consultant.  She has one child.  She has one to two drinks a  month.  She denies any drug use.  Does not routinely exercise or follow any  specific diet.   REVIEW OF SYSTEMS:  Positive for headache after receiving sublingual  nitroglycerin.  She had chest pain, shortness of breath and  tachypalpitations this morning, and has noted palpitations to some extent in  the past.   PHYSICAL EXAMINATION:  VITAL SIGNS:  Temperature 98.7, heart rate 87,  respirations 20, blood pressure 150/84, pulse oximetry 100% on room air.  GENERAL:  Pleasant white female in no distress.  Awake, alert and oriented  x3.  NECK:  Normal carotid upstrokes, no bruits or JVD.  LUNGS:  Respirations regular and unlabored, clear to auscultation.  CARDIAC:  Regular S1, S2.  No S3, S4, or murmurs.  ABDOMEN:  Round, soft, nontender, nondistended. Bowel sounds good x4.  EXTREMITIES:  Warm, dry, pink.  No calf tenderness or edema.  Dorsalis pedis  and posterior tibial pulses 2+ and equal bilaterally.  HEENT:  Atraumatic, normocephalic.  NEUROLOGIC:  Grossly intact, nonfocal.  SKIN:  Warm and dry without lesions or masses.   Chest x-ray is pending.  EKG is sinus rhythm, normal axis, rate of 77, and T-  wave inversion in lead III.   LABORATORY WORK:  Hemoglobin 15.6, hematocrit 46.0.  Sodium 141, potassium  3.9, chloride 108, CO2 23.8, BUN 8, creatinine 0.8, glucose 106.  CK-MB less  than 1.0, troponin I less than 0.05.   ASSESSMENT:  1. Chest pain, shortness of breath, and tachypalpitations.  Her chest pain      mostly resolved after five sublingual nitroglycerin.  Her cardiac      markers  are currently negative despite 3 hours of persistent discomfort      this morning.  She does have a family history of CAD at an early age in      her father.  Plan to admit, rule out, plan a cardiac catheterization      tomorrow.  Will add aspirin, beta blocker and heparin.  She is now pain      free and therefore will hold off on nitrate as this did cause a pretty      significant headache.  2. History of dysfunctional uterine bleeding.  Continue hormone therapy.  3. Lipids currently unknown.  Check a lipid panel.     ______________________________  Nicolasa Ducking, ANP      Jonelle Sidle, MD  Electronically Signed    CB/MEDQ  D:  01/19/2006  T:  01/20/2006  Job:  [id number]

## 2011-02-10 ENCOUNTER — Other Ambulatory Visit: Payer: Self-pay | Admitting: Family Medicine

## 2011-05-05 ENCOUNTER — Telehealth: Payer: Self-pay | Admitting: Family Medicine

## 2011-05-05 NOTE — Telephone Encounter (Signed)
Patient called in because she was not sure if she needed lab work before her 05/18/11 appointment. Please clarify & call pt to let her know. Thanks!

## 2011-05-05 NOTE — Telephone Encounter (Signed)
yes

## 2011-05-06 NOTE — Telephone Encounter (Signed)
Left message on machine for patient

## 2011-05-12 ENCOUNTER — Other Ambulatory Visit: Payer: Self-pay | Admitting: Family Medicine

## 2011-05-18 ENCOUNTER — Ambulatory Visit: Payer: BC Managed Care – PPO | Admitting: Family Medicine

## 2011-05-20 ENCOUNTER — Ambulatory Visit (INDEPENDENT_AMBULATORY_CARE_PROVIDER_SITE_OTHER): Payer: BC Managed Care – PPO | Admitting: Family Medicine

## 2011-05-20 ENCOUNTER — Encounter: Payer: Self-pay | Admitting: Family Medicine

## 2011-05-20 DIAGNOSIS — E785 Hyperlipidemia, unspecified: Secondary | ICD-10-CM

## 2011-05-20 DIAGNOSIS — Z0000 Encounter for general adult medical examination without abnormal findings: Secondary | ICD-10-CM

## 2011-05-20 DIAGNOSIS — Z23 Encounter for immunization: Secondary | ICD-10-CM

## 2011-05-20 DIAGNOSIS — R319 Hematuria, unspecified: Secondary | ICD-10-CM

## 2011-05-20 DIAGNOSIS — I10 Essential (primary) hypertension: Secondary | ICD-10-CM

## 2011-05-20 DIAGNOSIS — L570 Actinic keratosis: Secondary | ICD-10-CM | POA: Insufficient documentation

## 2011-05-20 LAB — POCT URINALYSIS DIPSTICK
Glucose, UA: NEGATIVE
Nitrite, UA: NEGATIVE
Urobilinogen, UA: 0.2

## 2011-05-20 LAB — CBC WITH DIFFERENTIAL/PLATELET
Basophils Absolute: 0 10*3/uL (ref 0.0–0.1)
Eosinophils Absolute: 0 10*3/uL (ref 0.0–0.7)
Lymphocytes Relative: 26.9 % (ref 12.0–46.0)
MCHC: 34.4 g/dL (ref 30.0–36.0)
Neutro Abs: 9.4 10*3/uL — ABNORMAL HIGH (ref 1.4–7.7)
Neutrophils Relative %: 66.4 % (ref 43.0–77.0)
Platelets: 399 10*3/uL (ref 150.0–400.0)
RDW: 12.5 % (ref 11.5–14.6)

## 2011-05-20 LAB — LIPID PANEL
Cholesterol: 154 mg/dL (ref 0–200)
Triglycerides: 68 mg/dL (ref 0.0–149.0)

## 2011-05-20 LAB — BASIC METABOLIC PANEL
Calcium: 9.4 mg/dL (ref 8.4–10.5)
GFR: 92.38 mL/min (ref >60.00)
Glucose, Bld: 82 mg/dL (ref 70–99)
Sodium: 138 mEq/L (ref 135–145)

## 2011-05-20 LAB — HEPATIC FUNCTION PANEL
ALT: 26 U/L (ref 0–35)
AST: 31 U/L (ref 0–37)
Alkaline Phosphatase: 79 U/L (ref 39–117)
Bilirubin, Direct: 0.1 mg/dL (ref 0.0–0.3)
Total Protein: 7.3 g/dL (ref 6.0–8.3)

## 2011-05-20 MED ORDER — PRAVASTATIN SODIUM 40 MG PO TABS: 40.0000 mg | ORAL_TABLET | Freq: Every day | ORAL | Status: DC

## 2011-05-20 MED ORDER — AMLODIPINE BESYLATE 5 MG PO TABS: 5.0000 mg | ORAL_TABLET | Freq: Every day | ORAL | Status: DC

## 2011-05-20 MED ORDER — LISINOPRIL 10 MG PO TABS: 10.0000 mg | ORAL_TABLET | Freq: Every day | ORAL | Status: DC

## 2011-05-20 NOTE — Patient Instructions (Signed)
Continue your current medications  Followup in 1 year sooner if any problems  We will call you within 2 weeks with the report

## 2011-05-20 NOTE — Progress Notes (Signed)
  Subjective:    Patient ID: Rachel Vance, female    DOB: 03/29/1956, 56 y.o.   MRN: [id number]  HPI Rachel Vance  is a 56 year old single female nonsmoker G1 P1 who comes in today for general physical examination  She has an underlying history of hypertension for which she takes Norvasc 5 mg daily and lisinopril 10 mg daily BP 112/70  She is on HRT the her GYN Dr. Grace Isaac.  She takes Pravachol 40 mg daily for hyperlipidemia and aspirin tablet lipids are at goal  She gets routine eye care, dental care, BSE monthly, and you mammography, colonoscopy 25 years ago recommend colonoscopy this year  Tetanus 2000 610 seasonal flu shot today  Review of systems negative except she has a red enlarging lesion on left thigh which were removed today. It appears to be an actinic keratosis  Review of Systems  Constitutional: Negative.   HENT: Negative.   Eyes: Negative.   Respiratory: Negative.   Cardiovascular: Negative.   Gastrointestinal: Negative.   Genitourinary: Negative.   Musculoskeletal: Negative.   Neurological: Negative.   Hematological: Negative.   Psychiatric/Behavioral: Negative.    After informed consent the lesion on the left medial thigh which measures 8 mm x8 mm was anesthetized with 1% Xylocaine with epinephrine. It was excised with 2 mm margins are at the base was cauterized and it was applied she tolerated the procedure no complications. It was sent for pathologic analysis clinically it appears to be an inflamed actinic keratosis    Objective:   Physical Exam  Constitutional: She appears well-developed and well-nourished.  HENT:  Head: Normocephalic and atraumatic.  Right Ear: External ear normal.  Left Ear: External ear normal.  Nose: Nose normal.  Mouth/Throat: Oropharynx is clear and moist.  Eyes: EOM are normal. Pupils are equal, round, and reactive to light.  Neck: Normal range of motion. Neck supple. No thyromegaly present.  Cardiovascular: Normal rate, regular  rhythm, normal heart sounds and intact distal pulses.  Exam reveals no gallop and no friction rub.   No murmur heard. Pulmonary/Chest: Effort normal and breath sounds normal.  Abdominal: Soft. Bowel sounds are normal. She exhibits no distension and no mass. There is no tenderness. There is no rebound.  Genitourinary:       Bilateral breast exam normal  Musculoskeletal: Normal range of motion.  Lymphadenopathy:    She has no cervical adenopathy.  Neurological: She is alert. She has normal reflexes. No cranial nerve deficit. She exhibits normal muscle tone. Coordination normal.  Skin: Skin is warm and dry.       She has light skin and blue eyes and has had a lot of sun exposure in the past. Total body skin exam normal except for a red inflamed lesion left upper medial thigh. Clinically it appears to be an inflamed actinic keratosis we will remove it today  Psychiatric: She has a normal mood and affect. Her behavior is normal. Judgment and thought content normal.          Assessment & Plan:  Healthy female  History of hypertension continue current medication  History of hyperlipidemia continue current medication  Abnormal lesion left thigh removed today  Perimenopausal symptoms continued followup by GYN

## 2011-05-21 DIAGNOSIS — Z23 Encounter for immunization: Secondary | ICD-10-CM

## 2011-08-17 ENCOUNTER — Encounter: Payer: Self-pay | Admitting: Gastroenterology

## 2011-08-18 ENCOUNTER — Encounter: Payer: Self-pay | Admitting: Family Medicine

## 2011-08-18 ENCOUNTER — Telehealth: Payer: Self-pay | Admitting: Family Medicine

## 2011-08-18 NOTE — Telephone Encounter (Signed)
Ms. Renaldo Fiddler had called while I was away having surgery wanting refills for her 56 year old son medication who did not been here in over year and a half. Fleet Contras gave him 3 prescriptions none of which she was happy with. The mom therefore called Sunday the other day complaining. At that juncture we felt the best course was to have the patient called directly since he is 56 years old and married and come in to see me sweetness it down and discuss what his concerns were and refill his medication. The mother was not happy with that. She called twice yesterday complaining 1 he to no "who was responsible for this". Syndrome tried her best to talk with the mother however it didn't work. I therefore inserted days call and explain we were sorry the prescription gets confused however since he did not been here in a year and a half the best thing for him was to call us and make an appointment this week and I would be happy to sit down and discuss his concerns. The mother became very out rate raised her voice yelling wanting to know who was responsible for this problem. At that juncture I explained to her that I hadn't ago at patient's to see and I could not communicate with her as long as she was yelling on the phone. She then call back again and again I explained to her the best thing would be for her son to command. She began yelling on the phone again at that point onset total am sorry after hangup I couldn't communicate with her yelling on the telephone.  Because this instance I do not feel comfortable seeing her or her son a long-term basis notes will be sent discharging him from the practice

## 2011-08-20 ENCOUNTER — Telehealth: Payer: Self-pay | Admitting: Family Medicine

## 2011-08-20 NOTE — Telephone Encounter (Signed)
Patient dismissed from [facility] by Kelle Darting, MD effective 08/18/2011. Dismissal letter sent out by certified / registered mail. rmf

## 2011-08-24 ENCOUNTER — Other Ambulatory Visit: Payer: Self-pay | Admitting: Obstetrics & Gynecology

## 2011-09-02 NOTE — Telephone Encounter (Signed)
Certified letter returned as undeliverable / refused. Letter sent out by 1st class mail. rmf

## 2012-10-04 ENCOUNTER — Other Ambulatory Visit: Payer: Self-pay | Admitting: Obstetrics & Gynecology

## 2013-10-16 ENCOUNTER — Other Ambulatory Visit: Payer: Self-pay | Admitting: Obstetrics & Gynecology

## 2013-10-18 LAB — CYTOLOGY - PAP

## 2015-12-10 ENCOUNTER — Other Ambulatory Visit: Payer: Self-pay | Admitting: Obstetrics & Gynecology

## 2015-12-11 LAB — CYTOLOGY - PAP

## 2016-02-06 ENCOUNTER — Ambulatory Visit (INDEPENDENT_AMBULATORY_CARE_PROVIDER_SITE_OTHER): Payer: BLUE CROSS/BLUE SHIELD | Admitting: Physician Assistant

## 2016-02-06 ENCOUNTER — Ambulatory Visit (INDEPENDENT_AMBULATORY_CARE_PROVIDER_SITE_OTHER): Payer: BLUE CROSS/BLUE SHIELD

## 2016-02-06 VITALS — BP 120/82 | HR 71 | Temp 98.1°F | Resp 18 | Ht 65.5 in | Wt 152.4 lb

## 2016-02-06 DIAGNOSIS — M549 Dorsalgia, unspecified: Secondary | ICD-10-CM | POA: Diagnosis not present

## 2016-02-06 DIAGNOSIS — R739 Hyperglycemia, unspecified: Secondary | ICD-10-CM | POA: Diagnosis not present

## 2016-02-06 DIAGNOSIS — Z131 Encounter for screening for diabetes mellitus: Secondary | ICD-10-CM

## 2016-02-06 DIAGNOSIS — Z23 Encounter for immunization: Secondary | ICD-10-CM | POA: Diagnosis not present

## 2016-02-06 DIAGNOSIS — R202 Paresthesia of skin: Secondary | ICD-10-CM

## 2016-02-06 DIAGNOSIS — R7303 Prediabetes: Secondary | ICD-10-CM | POA: Diagnosis not present

## 2016-02-06 LAB — GLUCOSE, POCT (MANUAL RESULT ENTRY): POC Glucose: 145 mg/dl — AB (ref 70–99)

## 2016-02-06 LAB — POCT GLYCOSYLATED HEMOGLOBIN (HGB A1C): Hemoglobin A1C: 6.3

## 2016-02-06 MED ORDER — CYCLOBENZAPRINE HCL 10 MG PO TABS: 5.0000 mg | ORAL_TABLET | Freq: Three times a day (TID) | ORAL | 0 refills | Status: DC | PRN

## 2016-02-06 MED ORDER — PREDNISONE 20 MG PO TABS
ORAL_TABLET | ORAL | 0 refills | Status: AC
Start: 2016-02-06 — End: 2016-02-15

## 2016-02-06 NOTE — Progress Notes (Addendum)
By signing my name below, I, Rachel Vance, attest that this documentation has been prepared under the direction and in the presence of Rachel Boston, PA-C.  Electronically Signed: Arvilla Market, Medical Scribe. 02/06/16. 9:34 AM.  02/06/2016 10:56 AM   DOB: [birth year] / MRN: [id number]  SUBJECTIVE:  Rachel Vance is a 60 y.o. female presenting for back pain onset 9 days ago. 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 Pt felt pain afterwards but continued to stay active in the 3 days after onset while she was on vacation. Pt reports right thumb pain, some neck pain, pleuritic pain, and sleep loss. Pain has prevented her from sleeping last night. Pain worsens when she has to raise her arms to do anything such as putting on deodorant and was slightly relieved with massage. Pt has been taking ibuprofen since Sunday (5 days ago). Pt does not smoke tobacco.  Pt last saw Dr. Aldona Bar in Aug - Sept for her physical. Pt has a FHx of DM (mother and maternal grandmother), but she doesn't have DM herself.   She is allergic to eucalyptus oil and penicillins.   She  has a past medical history of Hyperlipidemia and Hypertension.    She  reports that she has never smoked. She does not have any smokeless tobacco history on file. She reports that she drinks about 0.6 oz of alcohol per week . She reports that she does not use drugs. She  has no sexual activity history on file. The patient  has a past surgical history that includes Tonsillectomy and Cardiac catheterization (01/2006).  Her family history includes Diabetes in her maternal grandmother and mother; Heart attack in her father; Heart disease in her maternal grandmother, mother, and paternal grandmother; Hyperlipidemia in her mother; Hypertension in her mother.  Review of Systems    Musculoskeletal: Positive for back pain, joint pain and neck pain.  Psychiatric/Behavioral: The patient does not have insomnia.     The problem list and medications were reviewed and updated by myself where necessary and exist elsewhere in the encounter.   OBJECTIVE:  BP 120/82   Pulse 71   Temp 98.1 F (36.7 C) (Oral)   Resp 18   Ht 5' 5.5" (1.664 m)   Wt 152 lb 6.4 oz (69.1 kg)   SpO2 98%   BMI 24.97 kg/m   Physical Exam  Constitutional: She appears well-developed and well-nourished. No distress.  HENT:  Head: Normocephalic and atraumatic.  Eyes: Conjunctivae are normal.  Neck: Neck supple.  Cardiovascular: Normal rate.   Pulmonary/Chest: Effort normal.  Musculoskeletal:  Muscle spasm on her lower rhombiod  Lymphadenopathy:  No lymph adenopathy in the head and neck  Neurological: She is alert.  Reflex Scores:      Tricep reflexes are 2+ on the right side and 2+ on the left side.      Bicep reflexes are 2+ on the right side and 2+ on the left side.      Patellar reflexes are 2+ on the right side and 2+ on the left side. Nl strength in upper extremities Limited ROM from pain with 90 of abduction and adduction No step off deformity about the spine  Skin: Skin is warm and dry.  Psychiatric: She has a normal mood and affect. Her behavior is normal.  Nursing note and vitals reviewed.  Results for orders placed or performed in visit on 02/06/16 (from the past 72 hour(s))  POCT glucose (manual entry)     Status: Abnormal   Collection Time: 02/06/16 10:19 AM  Result Value Ref Range   POC Glucose 145 (A) 70 - 99 mg/dl  POCT glycosylated hemoglobin (Hb A1C)     Status: None   Collection Time: 02/06/16 10:44 AM  Result Value Ref Range   Hemoglobin A1C 6.3    Lab Results  Component Value Date   HGBA1C 6.3 02/06/2016     Dg Cervical Spine 2 Or 3 Views  Result Date: 02/06/2016 CLINICAL DATA:  Recent fall with right-sided neck pain radiating into the right arm,  initial encounter EXAM: CERVICAL SPINE - 3 VIEW COMPARISON:  None. FINDINGS: Seven cervical segments are well visualized. Mild osteophytic changes are seen. No anterolisthesis is noted. No acute fracture or acute facet abnormality is seen. No soft tissue abnormality is noted. The odontoid is within normal limits. IMPRESSION: Mild degenerative change without acute abnormality. Electronically Signed   By: Alcide CleverMark  Lukens M.D.   On: 02/06/2016 10:01   Dg Thoracic Spine 2 View  Result Date: 02/06/2016 CLINICAL DATA:  Recent fall with increasing upper right back pain, initial encounter EXAM: THORACIC SPINE 2 VIEWS COMPARISON:  None. FINDINGS: There is no evidence of thoracic spine fracture. Alignment is normal. No other significant bone abnormalities are identified. IMPRESSION: No acute abnormality noted. Electronically Signed   By: Alcide CleverMark  Lukens M.D.   On: 02/06/2016 10:00    ASSESSMENT AND PLAN  [name] was seen today for back pain.  Diagnoses and all orders for this visit:  Upper back pain -     DG Thoracic Spine 2 View; Future -     DG Cervical Spine 2 or 3 views; Future -     cyclobenzaprine (FLEXERIL) 10 MG tablet; Take 0.5-1 tablets (5-10 mg total) by mouth 3 (three) times daily as needed for muscle spasms (May cause drowsiness. Do no operate heavy machinery while taking.).  Paresthesia of right upper extremity -     predniSONE (DELTASONE) 20 MG tablet; Take 3 in the morning for 3 days, then 2 in the morning for 3 days, and then 1 in the morning for 3 days.  Screening for diabetes mellitus -     POCT glucose (manual entry)  Needs flu shot -     Flu Vaccine QUAD 36+ mos IM  Hyperglycemia -     POCT glycosylated hemoglobin (Hb A1C)  Prediabetes: Will treat with pred for first and second problem.  Advised diet and exericse.  RTC in three months for recheck A1c.      The patient is advised to call or return to clinic if she does not see an improvement in symptoms, or to seek the care of  the closest emergency department if she worsens with the above plan.   This note was scribed in my presence and I performed the services described in the this documentation.   Rachel BostonMichael Clark, MHS, PA-C [facility] 02/06/2016 10:56 AM

## 2016-02-06 NOTE — Patient Instructions (Addendum)
Stop your meloxicam and ibuprofen today.  Start prednisone tonight or tomorrow morning.  Take flexeril 5-10 mg as needed every 8 hours for pain, but start with 5 mg.  Come back in a week or two if you are not feeling better.     IF you received an x-ray today, you will receive an invoice [facility]. Please contact [facility] at [phone] with questions or concerns regarding your invoice.   IF you received labwork today, you will receive an invoice from [facility]. Please contact [facility] at [phone] with questions or concerns regarding your invoice.   Our billing staff will not be able to assist you with questions regarding bills from these companies.  You will be contacted with the lab results as soon as they are available. The fastest way to get your results is to activate your My Chart account. Instructions are located on the last page of this paperwork. If you have not heard from us regarding the results in 2 weeks, please contact this office.

## 2016-02-12 ENCOUNTER — Telehealth: Payer: Self-pay

## 2016-02-12 NOTE — Telephone Encounter (Signed)
Informed pt to RTC per Coralyn HellingMichael Clarks plan note. Transferred to make an appt for tomorrow 02/13/16

## 2016-02-12 NOTE — Telephone Encounter (Signed)
PATIENT STATES SHE SAW MICHAEL CLARK ABOUT A WEEK AGO FOR A (R) SHOULDER INJURY. HE TOLD HER TO CALL HIM BACK IF SHE DID NOT GET TO FEELING BETTER. SHE STILL HURTS AND HER (R) INDEX FINGER IS STILL NUMB. SHE SAID SHE IS TAKING THE PREDNISONE 20 MG AND FLEXERIL 10 MG JUST LIKE HE SAID.  BEST PHONE [phone] (CELL)  PHARMACY CHOICE IS [facility].  MBC

## 2016-02-13 ENCOUNTER — Ambulatory Visit: Payer: BLUE CROSS/BLUE SHIELD

## 2017-04-28 IMAGING — DX DG CERVICAL SPINE 2 OR 3 VIEWS
3 series · 3 of 3 positions shown · non-contrast
Comparison: None.

CLINICAL DATA: Recent fall with right-sided neck pain radiating
into the right arm, initial encounter

EXAM:
CERVICAL SPINE - 3 VIEW

[c-spine lat]
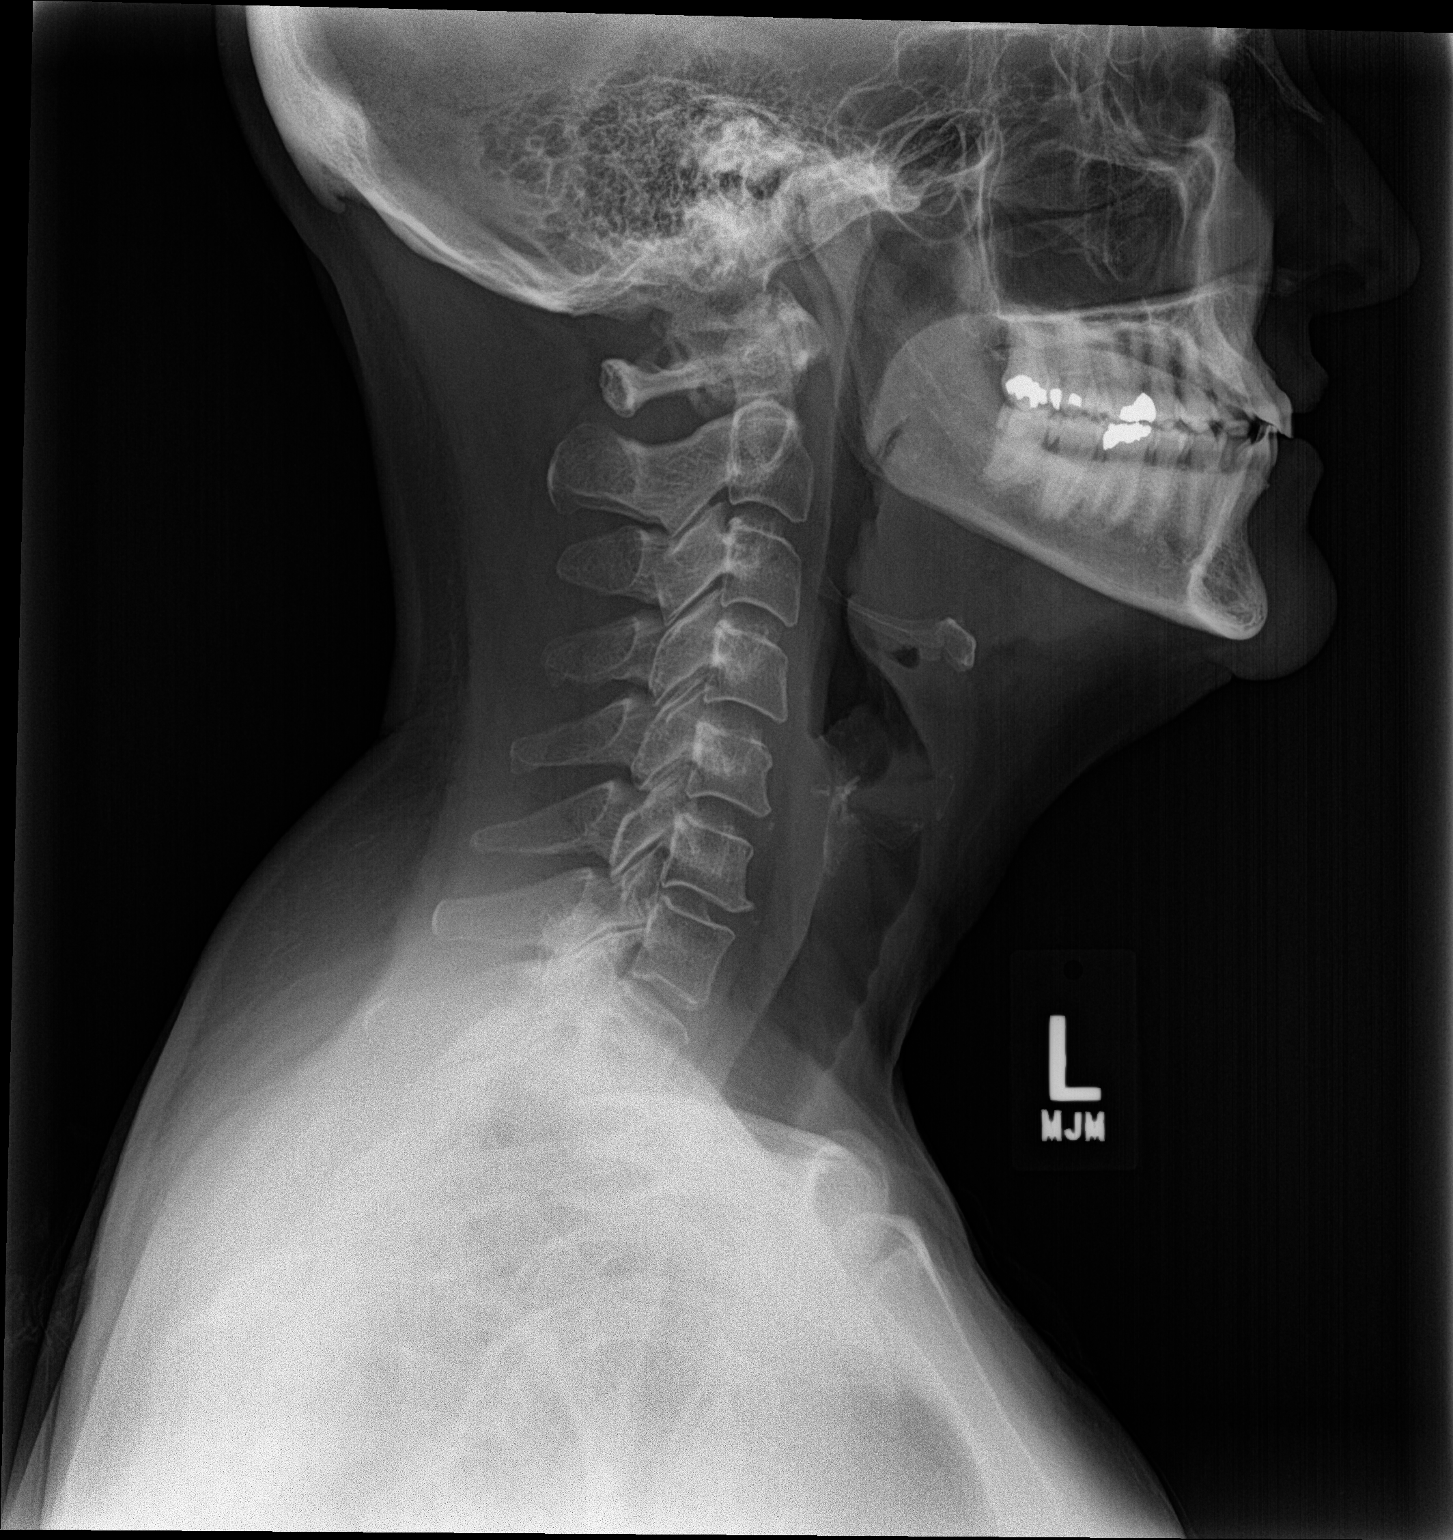

[c-spine ap]
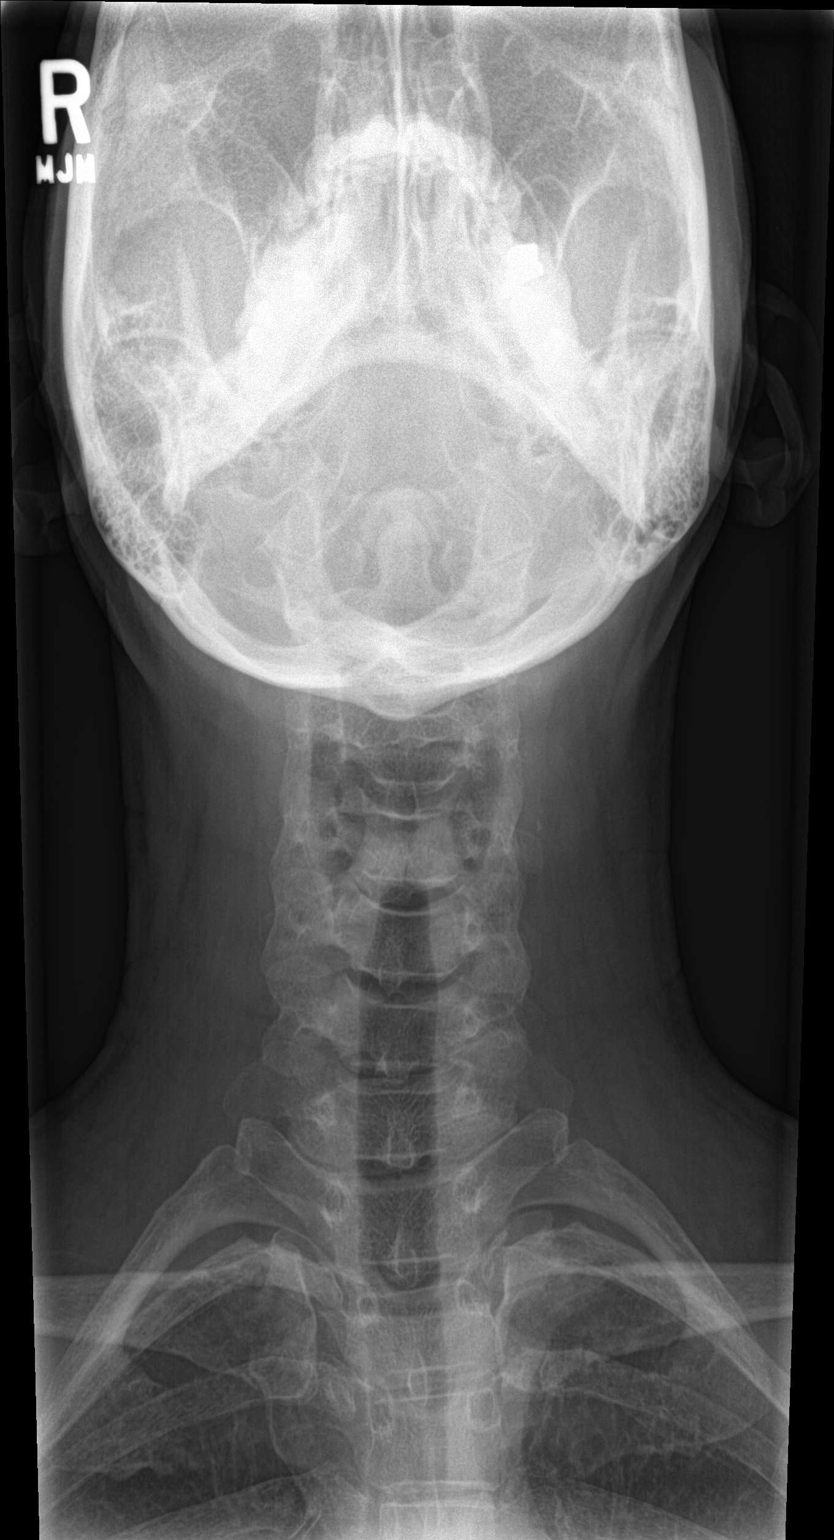

[c-spine open mouth]
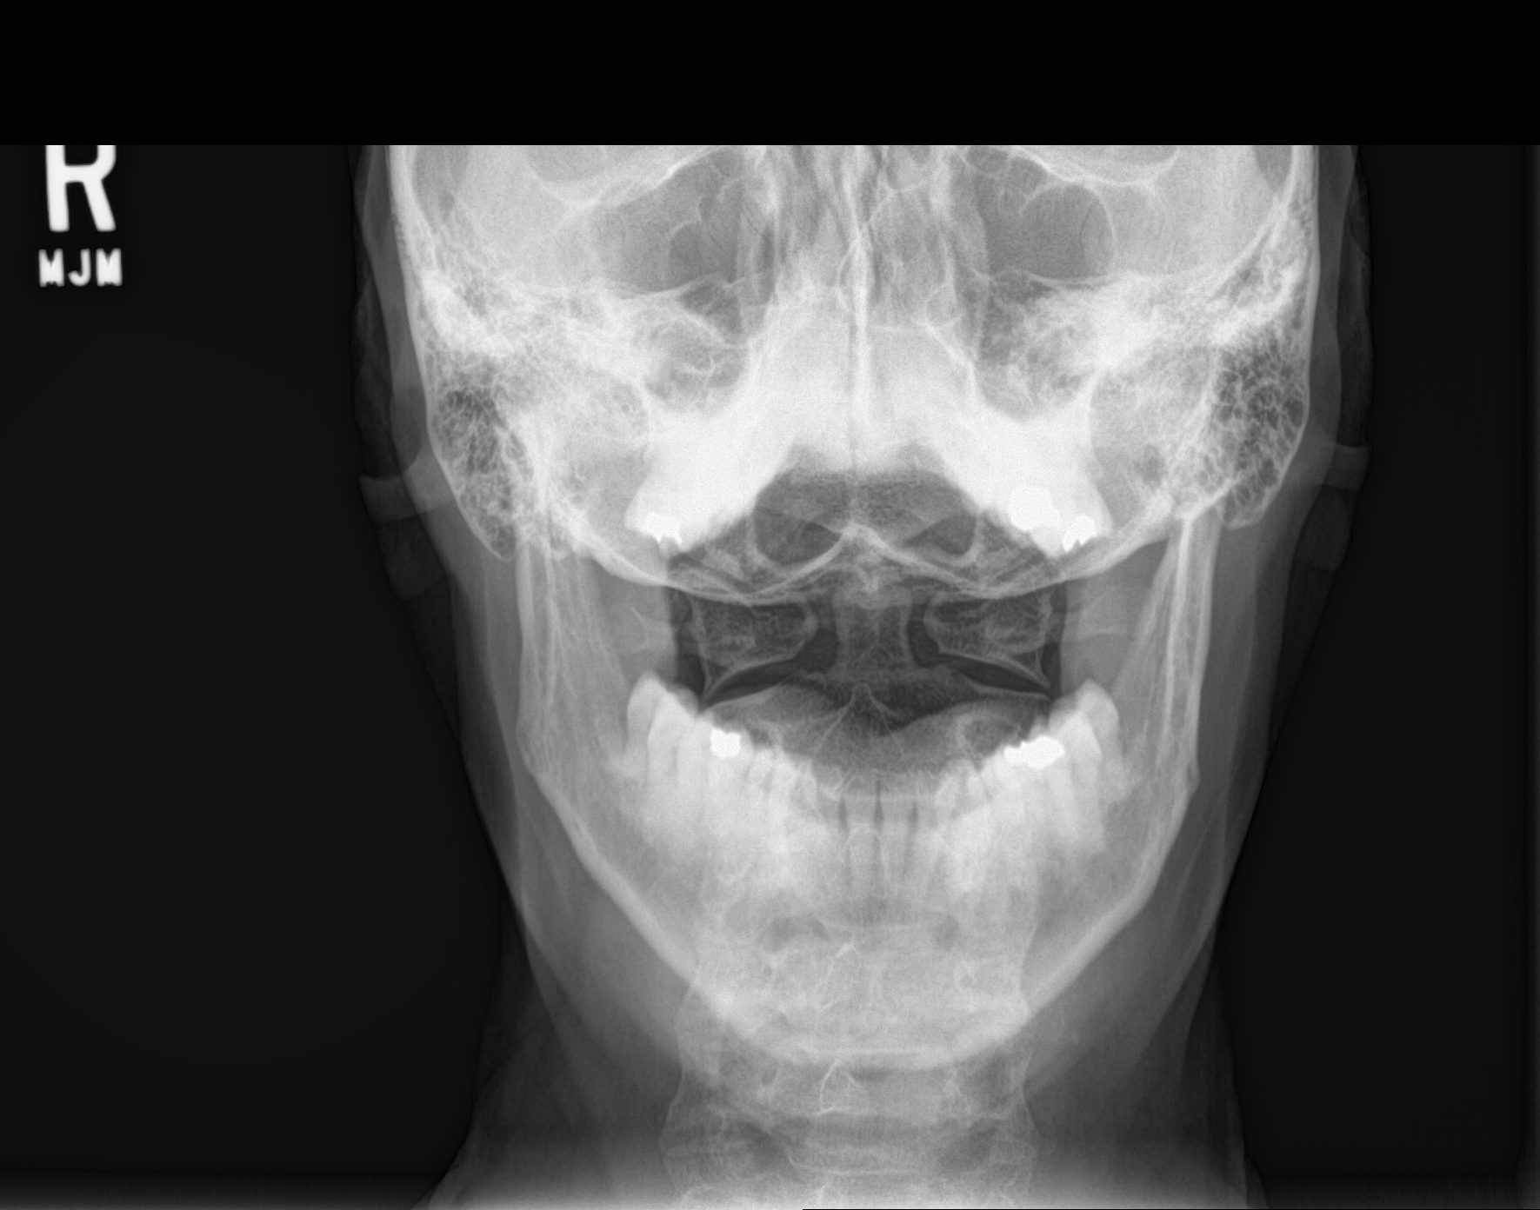

[3 of 3 positions shown; findings below may reference images not displayed]

FINDINGS: Seven cervical segments are well visualized. Mild osteophytic
changes are seen. No anterolisthesis is noted. No acute fracture or
acute facet abnormality is seen. No soft tissue abnormality is
noted. The odontoid is within normal limits.
IMPRESSION: Mild degenerative change without acute abnormality.

## 2019-11-30 ENCOUNTER — Ambulatory Visit (HOSPITAL_COMMUNITY)
Admission: EM | Admit: 2019-11-30 | Discharge: 2019-11-30 | Disposition: A | Discharge status: Home / Self Care | Payer: BC Managed Care – PPO | Attending: Family Medicine | Admitting: Family Medicine

## 2019-11-30 ENCOUNTER — Encounter (HOSPITAL_COMMUNITY): Payer: Self-pay | Admitting: Emergency Medicine

## 2019-11-30 ENCOUNTER — Other Ambulatory Visit: Payer: Self-pay

## 2019-11-30 DIAGNOSIS — R42 Dizziness and giddiness: Secondary | ICD-10-CM | POA: Diagnosis present

## 2019-11-30 DIAGNOSIS — G8929 Other chronic pain: Secondary | ICD-10-CM | POA: Insufficient documentation

## 2019-11-30 DIAGNOSIS — M545 Low back pain: Secondary | ICD-10-CM | POA: Diagnosis present

## 2019-11-30 LAB — CBC
HCT: 46.7 % — ABNORMAL HIGH (ref 36.0–46.0)
Hemoglobin: 15.8 g/dL — ABNORMAL HIGH (ref 12.0–15.0)
MCH: 31 pg (ref 26.0–34.0)
MCHC: 33.8 g/dL (ref 30.0–36.0)
MCV: 91.7 fL (ref 80.0–100.0)
Platelets: 286 10*3/uL (ref 150–400)
RBC: 5.09 MIL/uL (ref 3.87–5.11)
RDW: 12.3 % (ref 11.5–15.5)
WBC: 11.9 10*3/uL — ABNORMAL HIGH (ref 4.0–10.5)
nRBC: 0 % (ref 0.0–0.2)

## 2019-11-30 LAB — BASIC METABOLIC PANEL
Anion gap: 11 (ref 5–15)
BUN: 19 mg/dL (ref 8–23)
CO2: 25 mmol/L (ref 22–32)
Calcium: 9.9 mg/dL (ref 8.9–10.3)
Chloride: 105 mmol/L (ref 98–111)
Creatinine, Ser: 0.81 mg/dL (ref 0.44–1.00)
GFR calc Af Amer: >60 mL/min (ref >60)
GFR calc non Af Amer: >60 mL/min (ref >60)
Glucose, Bld: 107 mg/dL — ABNORMAL HIGH (ref 70–99)
Potassium: 3.6 mmol/L (ref 3.5–5.1)
Sodium: 141 mmol/L (ref 135–145)

## 2019-11-30 MED ORDER — ONDANSETRON 4 MG PO TBDP: 4.0000 mg | ORAL_TABLET | Freq: Three times a day (TID) | ORAL | 0 refills | Status: AC | PRN

## 2019-11-30 MED ORDER — MELOXICAM 15 MG PO TABS: 15.0000 mg | ORAL_TABLET | Freq: Every day | ORAL | 0 refills | Status: AC

## 2019-11-30 MED ORDER — MECLIZINE HCL 25 MG PO TABS: 25.0000 mg | ORAL_TABLET | Freq: Three times a day (TID) | ORAL | 0 refills | Status: AC | PRN

## 2019-11-30 NOTE — ED Triage Notes (Signed)
This RN was called over by patient access to assess patient.  Patient c/o dizziness intermittent, but worsening x 5 days.  Patient states last night was the worse when she went to sit up from laying with her dogs on the floor and she could barely get herself into her bed.  States this morning she had to hold on to things when she first got up in order to not fall.  States she has slammed into the wall a few times since Monday due to dizziness.  No neuro deficits noted by this RN at assessment, no facial droop or arm drop.  Patient denies numbness or weakness.

## 2019-11-30 NOTE — Discharge Instructions (Signed)

## 2019-12-13 NOTE — ED Provider Notes (Signed)
[facility]   [id number] 11/30/19 Arrival Time: 3159  ASSESSMENT & PLAN:  1. Vertigo   2. Chronic bilateral low back pain without sciatica     Normal neurologic exam. No suspicion for ICH or SAH. No indication for neurodiagnostic imaging at this time. Discussed.  Meds ordered this encounter  Medications  . meclizine (ANTIVERT) 25 MG tablet    Sig: Take 1 tablet (25 mg total) by mouth 3 (three) times daily as needed for dizziness.    Dispense:  30 tablet    Refill:  0  . ondansetron (ZOFRAN-ODT) 4 MG disintegrating tablet    Sig: Take 1 tablet (4 mg total) by mouth every 8 (eight) hours as needed for nausea or vomiting.    Dispense:  15 tablet    Refill:  0  . meloxicam (MOBIC) 15 MG tablet    Sig: Take 1 tablet (15 mg total) by mouth daily.    Dispense:  30 tablet    Refill:  0    Reassured that these symptoms do not appear to represent a serious or threatening condition. This is generally a self-limited temporary but uncomfortable situation. Rest, avoid potentially dangerous activities (such as driving or working with machinery or at heights). Use OTC Meclizine prn. Will proceed to the ED if he develops other symptoms such as alterations of speech, swallowing, vision, motor/sensory systems, or if dizziness worsens.    Follow-up Information    Schedule an appointment as soon as possible for a visit  with Aliene Beams, MD.   Specialty: Family Medicine Contact information: [address] [place] [address] [phone]               Reviewed expectations re: course of current medical issues. Questions answered. Outlined signs and symptoms indicating need for more acute intervention. Patient verbalized understanding. After Visit Summary given.   SUBJECTIVE:  Rachel Vance is a 64 y.o. female who reports intermittent dizziness described as vertigo. Current symptoms first noted approx 4 d ago and have gradually improved but still present at  times. Worse with movement; better when still. Denies coordination problems, gait problems, headaches, memory problems, paresthesia, seizures, speech problems, tremors and weakness as well as otalgia and hearing loss. Recent infections: none. Head trauma: denied. Noise exposure: no occupational exposure. No associated SOB, CP, or palpatations reported. Recent travel: none. Reports normal bowel/bladder habits. Previous workup/treatments: none. H/O similar: no. Therapies tried thus far: none.  Social History   Substance and Sexual Activity  Alcohol Use Yes  . Alcohol/week: 1.0 standard drink  . Types: 1 Glasses of wine per week   Social History   Tobacco Use  Smoking Status Never Smoker  Smokeless Tobacco Never Used   Denies illegal drug use.   OBJECTIVE:  Vitals:   11/30/19 1032  BP: (!) 168/83  Pulse: 72  Resp: 18  Temp: 98.2 F (36.8 C)  TempSrc: Oral  SpO2: 100%    General appearance: alert; no distress Eyes: PERRLA; EOMI; conjunctiva normal HENT: normocephalic; atraumatic; TMs normal; nasal mucosa normal; oral mucosa normal Neck: supple with FROM Lungs: clear to auscultation bilaterally; unlabored Heart: regular Abdomen: soft, non-tender; bowel sounds normal Extremities: no cyanosis or edema; symmetrical with no gross deformities Skin: warm and dry Neurologic: normal gait; DTR's normal and symmetric; CN 2-12 grossly intact; rapid changes in position do cause mild symptoms Psychological: alert and cooperative; normal mood and affect  Investigations: Results for orders placed or performed during the hospital encounter of 11/30/19  CBC  Result Value Ref Range   WBC 11.9 (H) 4.0 - 10.5 K/uL   RBC 5.09 3.87 - 5.11 MIL/uL   Hemoglobin 15.8 (H) 12.0 - 15.0 g/dL   HCT 28.3 (H) 36 - 46 %   MCV 91.7 80.0 - 100.0 fL   MCH 31.0 26.0 - 34.0 pg   MCHC 33.8 30.0 - 36.0 g/dL   RDW 15.1 76.1 - 60.7 %   Platelets 286 150 - 400 K/uL   nRBC 0.0 0.0 - 0.2 %  Basic metabolic  panel  Result Value Ref Range   Sodium 141 135 - 145 mmol/L   Potassium 3.6 3.5 - 5.1 mmol/L   Chloride 105 98 - 111 mmol/L   CO2 25 22 - 32 mmol/L   Glucose, Bld 107 (H) 70 - 99 mg/dL   BUN 19 8 - 23 mg/dL   Creatinine, Ser 3.71 0.44 - 1.00 mg/dL   Calcium 9.9 8.9 - 06.2 mg/dL   GFR calc non Af Amer >60 >60 mL/min   GFR calc Af Amer >60 >60 mL/min   Anion gap 11 5 - 15    Allergies  Allergen Reactions  . Eucalyptus Oil     SOB  . Penicillins     Past Medical History:  Diagnosis Date  . Hyperlipidemia   . Hypertension    Social History   Socioeconomic History  . Marital status: Married    Spouse name: Not on file  . Number of children: Not on file  . Years of education: Not on file  . Highest education level: Not on file  Occupational History  . Not on file  Tobacco Use  . Smoking status: Never Smoker  . Smokeless tobacco: Never Used  Substance and Sexual Activity  . Alcohol use: Yes    Alcohol/week: 1.0 standard drink    Types: 1 Glasses of wine per week  . Drug use: No  . Sexual activity: Not on file  Other Topics Concern  . Not on file  Social History Narrative  . Not on file   Social Determinants of Health   Financial Resource Strain:   . Difficulty of Paying Living Expenses: Not on file  Food Insecurity:   . Worried About Programme researcher, broadcasting/film/video in the Last Year: Not on file  . Ran Out of Food in the Last Year: Not on file  Transportation Needs:   . Lack of Transportation (Medical): Not on file  . Lack of Transportation (Non-Medical): Not on file  Physical Activity:   . Days of Exercise per Week: Not on file  . Minutes of Exercise per Session: Not on file  Stress:   . Feeling of Stress : Not on file  Social Connections:   . Frequency of Communication with Friends and Family: Not on file  . Frequency of Social Gatherings with Friends and Family: Not on file  . Attends Religious Services: Not on file  . Active Member of Clubs or Organizations: Not  on file  . Attends Banker Meetings: Not on file  . Marital Status: Not on file  Intimate Partner Violence:   . Fear of Current or Ex-Partner: Not on file  . Emotionally Abused: Not on file  . Physically Abused: Not on file  . Sexually Abused: Not on file   Family History  Problem Relation Age of Onset  . Heart attack Father   . Heart disease Mother        5 by-pass  .  Diabetes Mother   . Hypertension Mother   . Hyperlipidemia Mother   . Diabetes Maternal Grandmother   . Heart disease Maternal Grandmother   . Heart disease Paternal Grandmother    Past Surgical History:  Procedure Laterality Date  . CARDIAC CATHETERIZATION  01/2006   normal coronaries  . Greig Right, MD 12/13/19 [phone]

## 2020-04-04 LAB — COLOGUARD: COLOGUARD: NEGATIVE

## 2021-06-03 ENCOUNTER — Ambulatory Visit
Admission: RE | Admit: 2021-06-03 | Discharge: 2021-06-03 | Disposition: A | Discharge status: Home / Self Care | Payer: BC Managed Care – PPO | Source: Ambulatory Visit

## 2021-06-03 ENCOUNTER — Ambulatory Visit (INDEPENDENT_AMBULATORY_CARE_PROVIDER_SITE_OTHER): Payer: BC Managed Care – PPO

## 2021-06-03 VITALS — BP 135/80 | HR 95 | Temp 98.8°F | Resp 20

## 2021-06-03 DIAGNOSIS — J069 Acute upper respiratory infection, unspecified: Secondary | ICD-10-CM

## 2021-06-03 DIAGNOSIS — R0602 Shortness of breath: Secondary | ICD-10-CM | POA: Diagnosis not present

## 2021-06-03 DIAGNOSIS — R051 Acute cough: Secondary | ICD-10-CM

## 2021-06-03 MED ORDER — PROMETHAZINE-DM 6.25-15 MG/5ML PO SYRP: 5.0000 mL | ORAL_SOLUTION | Freq: Four times a day (QID) | ORAL | 0 refills | Status: AC | PRN

## 2021-06-03 MED ORDER — DOXYCYCLINE HYCLATE 100 MG PO CAPS: 100.0000 mg | ORAL_CAPSULE | Freq: Two times a day (BID) | ORAL | 0 refills | Status: AC

## 2021-06-03 MED ORDER — ALBUTEROL SULFATE HFA 108 (90 BASE) MCG/ACT IN AERS: 1.0000 | INHALATION_SPRAY | Freq: Four times a day (QID) | RESPIRATORY_TRACT | 0 refills | Status: AC | PRN

## 2021-06-03 NOTE — Discharge Instructions (Signed)
Your chest x-ray was normal.  You have been prescribed 3 medications to help alleviate symptoms.  Please follow-up if symptoms persist or worsen. °

## 2021-06-03 NOTE — ED Provider Notes (Signed)
[facility]    CSN: [id number] Arrival date & time: 06/03/21  1355      History   Chief Complaint Chief Complaint  Patient presents with   Cough    HPI Rachel Vance is a 66 y.o. female.   Patient presents with nasal congestion, productive cough, chest congestion, fatigue, nasal drainage that has been present for over 1 week.  [facility] and was prescribed prednisone, benzonatate, Mucinex, nasal spray with no improvement in symptoms.  Patient reports that the symptoms worsened on these medications.  Patient denies any known fevers or sick contacts.  She does endorse some intermittent shortness of breath at times.  Denies chest pain, sore throat, ear pain, nausea, vomiting, diarrhea, abdominal pain.  Patient denies history of asthma or COPD and is not a smoker.   Cough  Past Medical History:  Diagnosis Date   Hyperlipidemia    Hypertension     Patient Active Problem List   Diagnosis Date Noted   Actinic keratosis 05/20/2011   HEMATURIA UNSPECIFIED 02/03/2010   HYPERLIPIDEMIA 12/25/2008   HYPERTENSION 12/25/2008   WEIGHT GAIN 12/25/2008    Past Surgical History:  Procedure Laterality Date   CARDIAC CATHETERIZATION  01/2006   normal coronaries   TONSILLECTOMY      OB History   No obstetric history on file.      Home Medications    Prior to Admission medications   Medication Sig Start Date End Date Taking? Authorizing Provider  albuterol (VENTOLIN HFA) 108 (90 Base) MCG/ACT inhaler Inhale 1-2 puffs into the lungs every 6 (six) hours as needed for wheezing or shortness of breath. 06/03/21  Yes Anona Giovannini, [place] E, FNP  doxycycline (VIBRAMYCIN) 100 MG capsule Take 1 capsule (100 mg total) by mouth 2 (two) times daily. 06/03/21  Yes Ellizabeth Dacruz, Hildred Alamin E, FNP  lisinopril (PRINIVIL,ZESTRIL) 20 MG tablet Take 10 mg by mouth daily.    Yes [provider]  meloxicam (MOBIC) 15 MG tablet Take 1 tablet (15 mg total) by  mouth daily. 11/30/19  Yes Hagler, Aaron Edelman, MD  metFORMIN (GLUCOPHAGE-XR) 500 MG 24 hr tablet Take 500 mg by mouth daily. 05/08/21  Yes [provider]  promethazine-dextromethorphan (PROMETHAZINE-DM) 6.25-15 MG/5ML syrup Take 5 mLs by mouth 4 (four) times daily as needed for cough. 06/03/21  Yes [place] Yepiz, Hildred Alamin E, FNP  valsartan-hydrochlorothiazide (DIOVAN-HCT) 160-12.5 MG tablet Take 1 tablet by mouth daily. 05/27/21  Yes [provider]  amLODIPine-Valsartan-HCTZ 10-160-12.5 MG TABS Take by mouth.    [provider]  aspirin 81 MG tablet Take 81 mg by mouth daily.      [provider]  Aspirin Buff, Al Hyd-Mg Hyd, (ARTHRITIS PAIN FORMULA PO) Take by mouth.    [provider]  meclizine (ANTIVERT) 25 MG tablet Take 1 tablet (25 mg total) by mouth 3 (three) times daily as needed for dizziness. 11/30/19   Vanessa Kick, MD  multivitamin-iron-minerals-folic acid (CENTRUM) chewable tablet Chew 1 tablet by mouth daily.      [provider]  ondansetron (ZOFRAN-ODT) 4 MG disintegrating tablet Take 1 tablet (4 mg total) by mouth every 8 (eight) hours as needed for nausea or vomiting. 11/30/19   Vanessa Kick, MD  amLODipine (NORVASC) 5 MG tablet Take 1 tablet (5 mg total) by mouth daily. 05/20/11 11/30/19  Dorena Cookey, MD  levonorgestrel-ethinyl estradiol (SEASONALE,INTROVALE,JOLESSA) 0.15-0.03 MG tablet Take 1 tablet by mouth daily.  11/30/19  [provider]  pravastatin (  PRAVACHOL) 40 MG tablet Take 1 tablet (40 mg total) by mouth daily. 05/20/11 11/30/19  Dorena Cookey, MD    Family History Family History  Problem Relation Age of Onset   Heart attack Father    Heart disease Mother        5 by-pass   Diabetes Mother    Hypertension Mother    Hyperlipidemia Mother    Diabetes Maternal Grandmother    Heart disease Maternal Grandmother    Heart disease Paternal Grandmother     Social History Social History   Tobacco Use   Smoking status:  Never   Smokeless tobacco: Never  Substance Use Topics   Alcohol use: Yes    Alcohol/week: 1.0 standard drink    Types: 1 Glasses of wine per week   Drug use: No     Allergies   Eucalyptus oil and Penicillins   Review of Systems Review of Systems Per HPI  Physical Exam Triage Vital Signs ED Triage Vitals  Enc Vitals Group     BP 06/03/21 1412 135/80     Pulse Rate 06/03/21 1412 95     Resp 06/03/21 1412 20     Temp 06/03/21 1412 98.8 F (37.1 C)     Temp Source 06/03/21 1412 Oral     SpO2 06/03/21 1412 92 %     Weight --      Height --      Head Circumference --      Peak Flow --      Pain Score 06/03/21 1414 8     Pain Loc --      Pain Edu? --      Excl. in [place]? --    No data found.  Updated Vital Signs BP 135/80 (BP Location: Left Arm)    Pulse 95    Temp 98.8 F (37.1 C) (Oral)    Resp 20    SpO2 95%   Visual Acuity Right Eye Distance:   Left Eye Distance:   Bilateral Distance:    Right Eye Near:   Left Eye Near:    Bilateral Near:     Physical Exam Constitutional:      General: She is not in acute distress.    Appearance: Normal appearance. She is not toxic-appearing or diaphoretic.  HENT:     Head: Normocephalic and atraumatic.     Right Ear: Tympanic membrane and ear canal normal.     Left Ear: Tympanic membrane and ear canal normal.     Nose: Congestion present.     Mouth/Throat:     Mouth: Mucous membranes are moist.     Pharynx: No posterior oropharyngeal erythema.  Eyes:     Extraocular Movements: Extraocular movements intact.     Conjunctiva/sclera: Conjunctivae normal.     Pupils: Pupils are equal, round, and reactive to light.  Cardiovascular:     Rate and Rhythm: Normal rate and regular rhythm.     Pulses: Normal pulses.     Heart sounds: Normal heart sounds.  Pulmonary:     Effort: Pulmonary effort is normal. No respiratory distress.     Breath sounds: No stridor. Rhonchi present. No wheezing or rales.  Abdominal:     General:  Abdomen is flat. Bowel sounds are normal.     Palpations: Abdomen is soft.  Musculoskeletal:        General: Normal range of motion.     Cervical back: Normal range of motion.  Skin:  General: Skin is warm and dry.  Neurological:     General: No focal deficit present.     Mental Status: She is alert and oriented to person, place, and time. Mental status is at baseline.  Psychiatric:        Mood and Affect: Mood normal.        Behavior: Behavior normal.     UC Treatments / Results  Labs (all labs ordered are listed, but only abnormal results are displayed) Labs Reviewed - No data to display  EKG   Radiology DG Chest 2 View  Result Date: 06/03/2021 CLINICAL DATA:  Shortness of breath EXAM: CHEST - 2 VIEW COMPARISON:  None FINDINGS: The cardiomediastinal silhouette is within normal limits. There is no focal airspace consolidation. There is no large pleural effusion. There is no visible pneumothorax. There is no acute osseous abnormality. IMPRESSION: No evidence of acute cardiopulmonary disease. Electronically Signed   By: Maurine Simmering M.D.   On: 06/03/2021 15:20    Procedures Procedures (including critical care time)  Medications Ordered in UC Medications - No data to display  Initial Impression / Assessment and Plan / UC Course  I have reviewed the triage vital signs and the nursing notes.  Pertinent labs & imaging results that were available during my care of the patient were reviewed by me and considered in my medical decision making (see chart for details).     Chest x-ray was negative for any acute cardiopulmonary process.  Will prescribe doxycycline given severity of symptoms and to cover for atypicals.  Promethazine DM prescribed to take as needed for cough given that benzonatate is not currently working for patient.  Advised patient this can cause drowsiness.  Albuterol inhaler to take as needed for shortness of breath.  Discussed strict return precautions.  Patient  verbalized understanding and was agreeable with plan. Final Clinical Impressions(s) / UC Diagnoses   Final diagnoses:  Acute upper respiratory infection  Acute cough     Discharge Instructions      Your chest x-ray was normal.  You have been prescribed 3 medications to help alleviate symptoms.  Please follow-up if symptoms persist or worsen.     ED Prescriptions     Medication Sig Dispense Auth. Provider   doxycycline (VIBRAMYCIN) 100 MG capsule Take 1 capsule (100 mg total) by mouth 2 (two) times daily. 20 capsule [place], Hildred Alamin E, [place]   albuterol (VENTOLIN HFA) 108 (90 Base) MCG/ACT inhaler Inhale 1-2 puffs into the lungs every 6 (six) hours as needed for wheezing or shortness of breath. 1 each [place], Hildred Alamin E, [place]   promethazine-dextromethorphan (PROMETHAZINE-DM) 6.25-15 MG/5ML syrup Take 5 mLs by mouth 4 (four) times daily as needed for cough. 118 mL Teodora Medici, [place]      PDMP not reviewed this encounter.   Teodora Medici, [place] 06/03/21 1534

## 2021-06-03 NOTE — ED Triage Notes (Signed)
Patient c/o possible sinus infection, productive cough, chest congestion, fatigue x 6 days, nasal drainage.  Patient has been taken Prednisone, Benzonatate, Mucinex Sinus Max w/o any improvement.

## 2022-08-24 IMAGING — DX DG CHEST 2V
2 series · 2 of 2 positions shown · non-contrast
Comparison: None

CLINICAL DATA: Shortness of breath

EXAM:
CHEST - 2 VIEW

[chest pa]
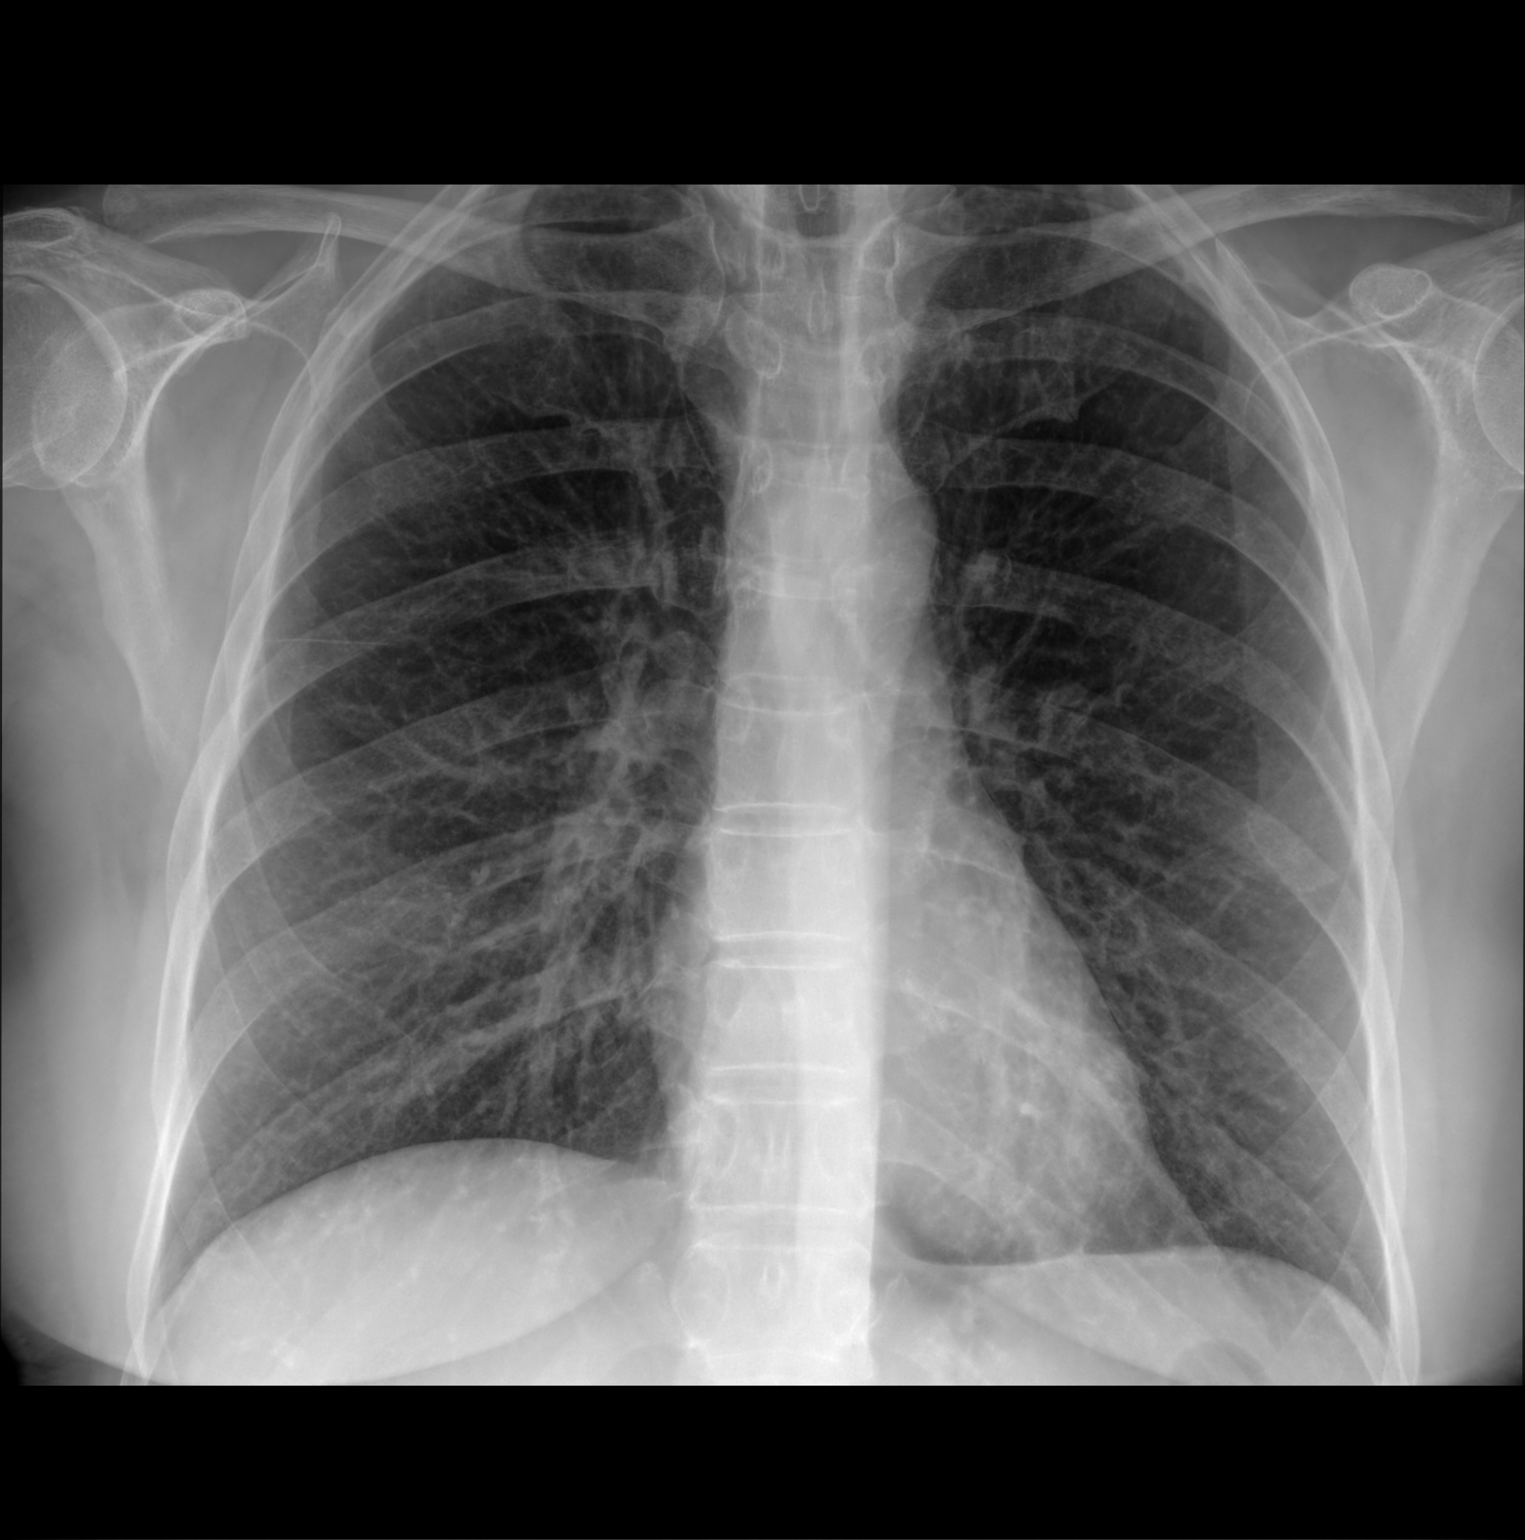

[chest lat]
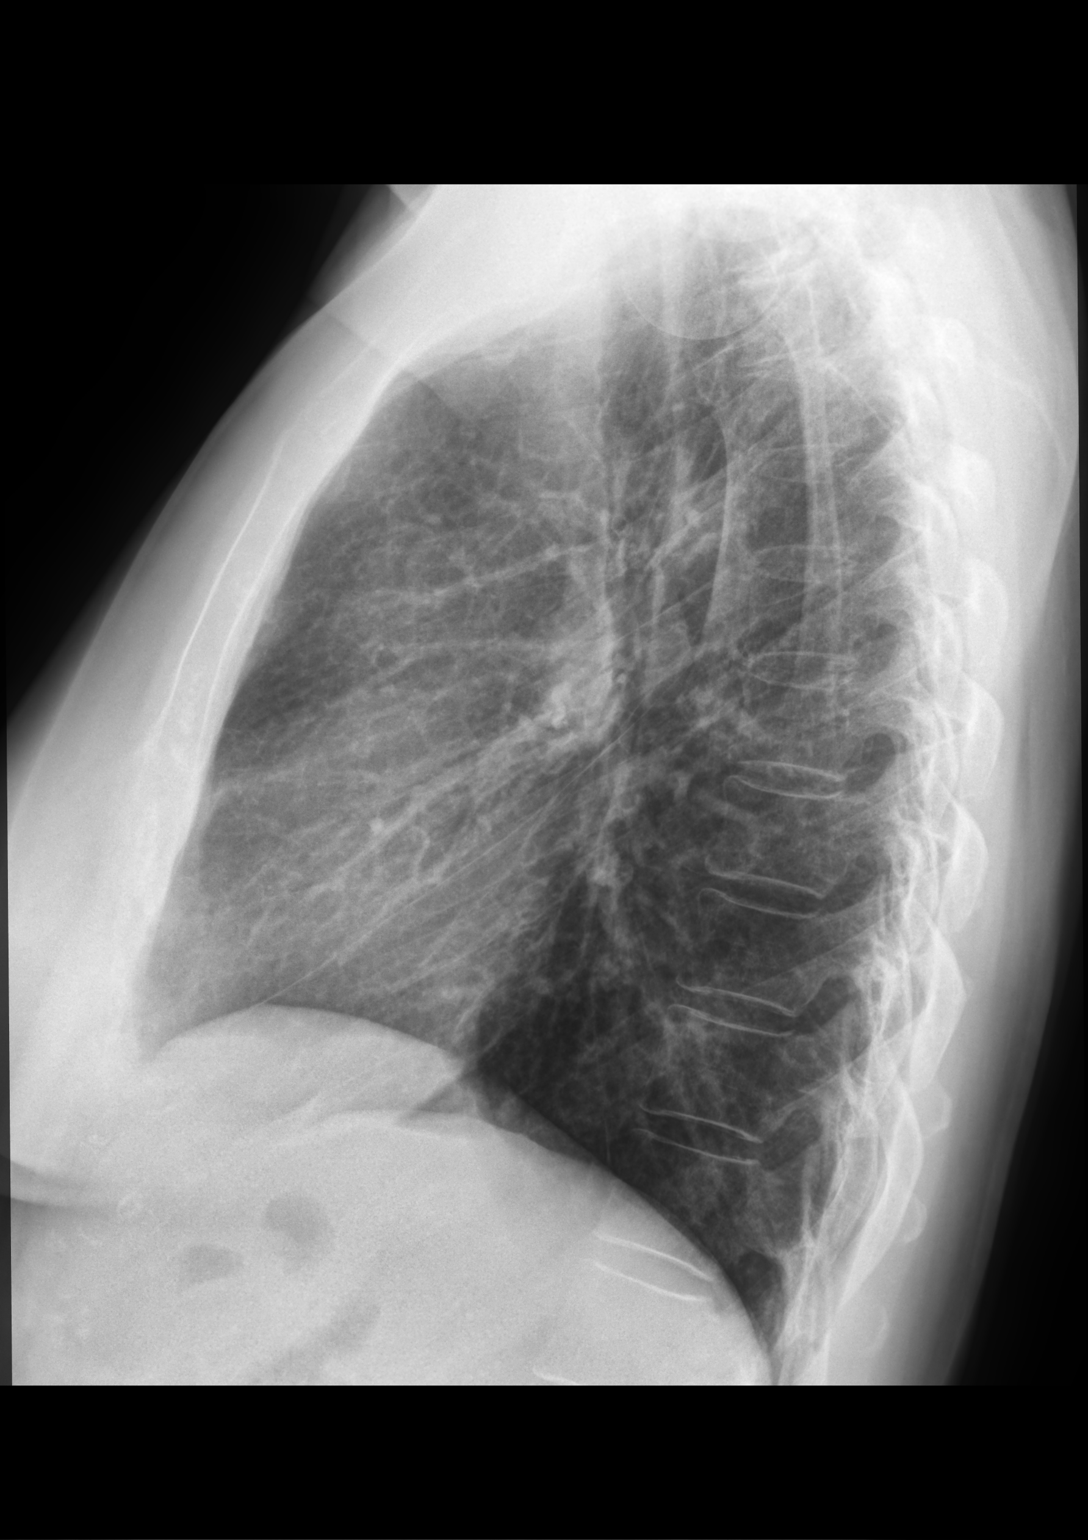

[2 of 2 positions shown; findings below may reference images not displayed]

FINDINGS: The cardiomediastinal silhouette is within normal limits. There is
no focal airspace consolidation. There is no large pleural effusion.
There is no visible pneumothorax. There is no acute osseous
abnormality.
IMPRESSION: No evidence of acute cardiopulmonary disease.

## 2023-12-27 ENCOUNTER — Telehealth: Payer: Self-pay | Admitting: General Practice

## 2023-12-27 NOTE — Telephone Encounter (Unsigned)
 Copied from CRM [id number]. Topic: Appointments - Scheduling Inquiry for Clinic >> Dec 27, 2023  2:37 PM Rea C wrote: Reason for CRM: Patient called in to schedule new patient appointment with Ginger Patrick, NP. Epic is showing that patient was discharged from practice back in 2013 and did not present a scheduling template. Patient stated that she's never been a patient at the office, only her mother, whose passed away. Patient is not particular about dates but would like to be scheduled with [facility] due to good reviews she's heard.    [phone] (M)

## 2023-12-28 NOTE — Telephone Encounter (Signed)
 I will accept her as a new patient.  Best to give her benefit of the doubt.

## 2024-01-04 ENCOUNTER — Telehealth: Payer: Self-pay | Admitting: General Practice

## 2024-01-04 NOTE — Telephone Encounter (Signed)
 Had talked with pt last week to explain she had been dismissed from [employer] and once a pt is dismissed, it is permanent and applies to all sites. I had committed to asking if those parameters still applied. I contacted Dr. Rollene and she indicated dismissal is permanent. Advised pt of this during phone call today. Pt stated it was the physician's fault that she became upset because he wrote the wrong prescription and he wasn't all her fault. She asked me several times if that was in the note. I explained the time to appeal would have been in 2013. She stated she didn't know, she stated she wasn't aware of the certified letter being refused. Based on the escalation of the patient, I support the dismissal.
